# Patient Record
Sex: Female | Born: 1940
Health system: Southern US, Community
[De-identification: ages and names within clinical notes are randomized; demographics above are authoritative.]

## PROBLEM LIST (undated history)

## (undated) DIAGNOSIS — C44310 Basal cell carcinoma of skin of unspecified parts of face: Secondary | ICD-10-CM

## (undated) DIAGNOSIS — Z87898 Personal history of other specified conditions: Secondary | ICD-10-CM

## (undated) DIAGNOSIS — T7840XA Allergy, unspecified, initial encounter: Secondary | ICD-10-CM

## (undated) DIAGNOSIS — I2510 Atherosclerotic heart disease of native coronary artery without angina pectoris: Secondary | ICD-10-CM

## (undated) DIAGNOSIS — E871 Hypo-osmolality and hyponatremia: Secondary | ICD-10-CM

## (undated) DIAGNOSIS — J189 Pneumonia, unspecified organism: Secondary | ICD-10-CM

## (undated) DIAGNOSIS — R4181 Age-related cognitive decline: Secondary | ICD-10-CM

## (undated) DIAGNOSIS — K5730 Diverticulosis of large intestine without perforation or abscess without bleeding: Secondary | ICD-10-CM

## (undated) DIAGNOSIS — I10 Essential (primary) hypertension: Secondary | ICD-10-CM

## (undated) DIAGNOSIS — R911 Solitary pulmonary nodule: Secondary | ICD-10-CM

## (undated) DIAGNOSIS — C4370 Malignant melanoma of unspecified lower limb, including hip: Secondary | ICD-10-CM

## (undated) DIAGNOSIS — J439 Emphysema, unspecified: Secondary | ICD-10-CM

## (undated) DIAGNOSIS — R5382 Chronic fatigue, unspecified: Secondary | ICD-10-CM

## (undated) DIAGNOSIS — I739 Peripheral vascular disease, unspecified: Secondary | ICD-10-CM

## (undated) DIAGNOSIS — E785 Hyperlipidemia, unspecified: Secondary | ICD-10-CM

## (undated) HISTORY — DX: Essential (primary) hypertension: I10

## (undated) HISTORY — DX: Basal cell carcinoma of skin of unspecified parts of face: C44.310

## (undated) HISTORY — DX: Peripheral vascular disease, unspecified: I73.9

## (undated) HISTORY — DX: Age-related cognitive decline: R41.81

## (undated) HISTORY — DX: Hyperlipidemia, unspecified: E78.5

## (undated) HISTORY — DX: Allergy, unspecified, initial encounter: T78.40XA

## (undated) HISTORY — DX: Diverticulosis of large intestine without perforation or abscess without bleeding: K57.30

## (undated) HISTORY — DX: Chronic fatigue, unspecified: R53.82

## (undated) HISTORY — PX: BASAL CELL CARCINOMA EXCISION: SHX1214

## (undated) HISTORY — PX: MELANOMA EXCISION: SHX5266

## (undated) HISTORY — DX: Malignant melanoma of unspecified lower limb, including hip: C43.70

## (undated) HISTORY — DX: Solitary pulmonary nodule: R91.1

## (undated) HISTORY — DX: Personal history of other specified conditions: Z87.898

## (undated) HISTORY — DX: Emphysema, unspecified: J43.9

## (undated) HISTORY — DX: Atherosclerotic heart disease of native coronary artery without angina pectoris: I25.10

## (undated) HISTORY — DX: Hypo-osmolality and hyponatremia: E87.1

## (undated) HISTORY — PX: APPENDECTOMY: SHX54

---

## 2016-02-15 DIAGNOSIS — R69 Illness, unspecified: Secondary | ICD-10-CM | POA: Diagnosis not present

## 2016-03-07 DIAGNOSIS — B078 Other viral warts: Secondary | ICD-10-CM | POA: Diagnosis not present

## 2016-03-07 DIAGNOSIS — Z08 Encounter for follow-up examination after completed treatment for malignant neoplasm: Secondary | ICD-10-CM | POA: Diagnosis not present

## 2016-03-07 DIAGNOSIS — L570 Actinic keratosis: Secondary | ICD-10-CM | POA: Diagnosis not present

## 2016-03-07 DIAGNOSIS — Z1283 Encounter for screening for malignant neoplasm of skin: Secondary | ICD-10-CM | POA: Diagnosis not present

## 2016-03-07 DIAGNOSIS — X32XXXD Exposure to sunlight, subsequent encounter: Secondary | ICD-10-CM | POA: Diagnosis not present

## 2016-03-07 DIAGNOSIS — Z85820 Personal history of malignant melanoma of skin: Secondary | ICD-10-CM | POA: Diagnosis not present

## 2016-03-14 DIAGNOSIS — J069 Acute upper respiratory infection, unspecified: Secondary | ICD-10-CM | POA: Diagnosis not present

## 2016-03-15 DIAGNOSIS — J069 Acute upper respiratory infection, unspecified: Secondary | ICD-10-CM | POA: Diagnosis not present

## 2016-03-30 DIAGNOSIS — J209 Acute bronchitis, unspecified: Secondary | ICD-10-CM | POA: Diagnosis not present

## 2016-04-30 DIAGNOSIS — L570 Actinic keratosis: Secondary | ICD-10-CM | POA: Diagnosis not present

## 2016-04-30 DIAGNOSIS — X32XXXD Exposure to sunlight, subsequent encounter: Secondary | ICD-10-CM | POA: Diagnosis not present

## 2016-04-30 DIAGNOSIS — L820 Inflamed seborrheic keratosis: Secondary | ICD-10-CM | POA: Diagnosis not present

## 2016-05-01 DIAGNOSIS — R69 Illness, unspecified: Secondary | ICD-10-CM | POA: Diagnosis not present

## 2016-05-02 DIAGNOSIS — Z6821 Body mass index (BMI) 21.0-21.9, adult: Secondary | ICD-10-CM | POA: Diagnosis not present

## 2016-05-02 DIAGNOSIS — Z87891 Personal history of nicotine dependence: Secondary | ICD-10-CM | POA: Diagnosis not present

## 2016-05-02 DIAGNOSIS — Z0000 Encounter for general adult medical examination without abnormal findings: Secondary | ICD-10-CM | POA: Diagnosis not present

## 2016-05-05 DIAGNOSIS — H524 Presbyopia: Secondary | ICD-10-CM | POA: Diagnosis not present

## 2016-05-28 DIAGNOSIS — Z0100 Encounter for examination of eyes and vision without abnormal findings: Secondary | ICD-10-CM | POA: Diagnosis not present

## 2016-06-13 DIAGNOSIS — L820 Inflamed seborrheic keratosis: Secondary | ICD-10-CM | POA: Diagnosis not present

## 2016-06-13 DIAGNOSIS — L570 Actinic keratosis: Secondary | ICD-10-CM | POA: Diagnosis not present

## 2016-06-13 DIAGNOSIS — C4372 Malignant melanoma of left lower limb, including hip: Secondary | ICD-10-CM | POA: Diagnosis not present

## 2016-06-13 DIAGNOSIS — X32XXXD Exposure to sunlight, subsequent encounter: Secondary | ICD-10-CM | POA: Diagnosis not present

## 2016-06-25 DIAGNOSIS — C4372 Malignant melanoma of left lower limb, including hip: Secondary | ICD-10-CM | POA: Diagnosis not present

## 2016-07-16 DIAGNOSIS — X32XXXD Exposure to sunlight, subsequent encounter: Secondary | ICD-10-CM | POA: Diagnosis not present

## 2016-07-16 DIAGNOSIS — L570 Actinic keratosis: Secondary | ICD-10-CM | POA: Diagnosis not present

## 2016-08-01 DIAGNOSIS — H61002 Unspecified perichondritis of left external ear: Secondary | ICD-10-CM | POA: Diagnosis not present

## 2016-09-26 DIAGNOSIS — L814 Other melanin hyperpigmentation: Secondary | ICD-10-CM | POA: Diagnosis not present

## 2016-09-26 DIAGNOSIS — Z1283 Encounter for screening for malignant neoplasm of skin: Secondary | ICD-10-CM | POA: Diagnosis not present

## 2016-09-26 DIAGNOSIS — Z85820 Personal history of malignant melanoma of skin: Secondary | ICD-10-CM | POA: Diagnosis not present

## 2016-09-26 DIAGNOSIS — D225 Melanocytic nevi of trunk: Secondary | ICD-10-CM | POA: Diagnosis not present

## 2016-09-26 DIAGNOSIS — D485 Neoplasm of uncertain behavior of skin: Secondary | ICD-10-CM | POA: Diagnosis not present

## 2016-09-26 DIAGNOSIS — L821 Other seborrheic keratosis: Secondary | ICD-10-CM | POA: Diagnosis not present

## 2016-09-26 DIAGNOSIS — L818 Other specified disorders of pigmentation: Secondary | ICD-10-CM | POA: Diagnosis not present

## 2016-09-26 DIAGNOSIS — Z08 Encounter for follow-up examination after completed treatment for malignant neoplasm: Secondary | ICD-10-CM | POA: Diagnosis not present

## 2016-11-24 DIAGNOSIS — R69 Illness, unspecified: Secondary | ICD-10-CM | POA: Diagnosis not present

## 2017-01-14 DIAGNOSIS — L820 Inflamed seborrheic keratosis: Secondary | ICD-10-CM | POA: Diagnosis not present

## 2017-01-14 DIAGNOSIS — L821 Other seborrheic keratosis: Secondary | ICD-10-CM | POA: Diagnosis not present

## 2017-01-14 DIAGNOSIS — Z08 Encounter for follow-up examination after completed treatment for malignant neoplasm: Secondary | ICD-10-CM | POA: Diagnosis not present

## 2017-01-14 DIAGNOSIS — Z1283 Encounter for screening for malignant neoplasm of skin: Secondary | ICD-10-CM | POA: Diagnosis not present

## 2017-01-14 DIAGNOSIS — Z85820 Personal history of malignant melanoma of skin: Secondary | ICD-10-CM | POA: Diagnosis not present

## 2017-03-20 DIAGNOSIS — R194 Change in bowel habit: Secondary | ICD-10-CM | POA: Diagnosis not present

## 2017-03-29 DIAGNOSIS — R194 Change in bowel habit: Secondary | ICD-10-CM | POA: Diagnosis not present

## 2017-06-02 DIAGNOSIS — Z0100 Encounter for examination of eyes and vision without abnormal findings: Secondary | ICD-10-CM | POA: Diagnosis not present

## 2017-06-02 DIAGNOSIS — H524 Presbyopia: Secondary | ICD-10-CM | POA: Diagnosis not present

## 2017-06-03 DIAGNOSIS — X32XXXD Exposure to sunlight, subsequent encounter: Secondary | ICD-10-CM | POA: Diagnosis not present

## 2017-06-03 DIAGNOSIS — L820 Inflamed seborrheic keratosis: Secondary | ICD-10-CM | POA: Diagnosis not present

## 2017-06-03 DIAGNOSIS — L570 Actinic keratosis: Secondary | ICD-10-CM | POA: Diagnosis not present

## 2017-06-03 DIAGNOSIS — Z08 Encounter for follow-up examination after completed treatment for malignant neoplasm: Secondary | ICD-10-CM | POA: Diagnosis not present

## 2017-06-03 DIAGNOSIS — Z85820 Personal history of malignant melanoma of skin: Secondary | ICD-10-CM | POA: Diagnosis not present

## 2017-06-03 DIAGNOSIS — Z1283 Encounter for screening for malignant neoplasm of skin: Secondary | ICD-10-CM | POA: Diagnosis not present

## 2017-07-17 DIAGNOSIS — L820 Inflamed seborrheic keratosis: Secondary | ICD-10-CM | POA: Diagnosis not present

## 2017-07-17 DIAGNOSIS — Z85820 Personal history of malignant melanoma of skin: Secondary | ICD-10-CM | POA: Diagnosis not present

## 2017-07-17 DIAGNOSIS — Z08 Encounter for follow-up examination after completed treatment for malignant neoplasm: Secondary | ICD-10-CM | POA: Diagnosis not present

## 2017-07-17 DIAGNOSIS — L03115 Cellulitis of right lower limb: Secondary | ICD-10-CM | POA: Diagnosis not present

## 2017-07-31 DIAGNOSIS — X32XXXD Exposure to sunlight, subsequent encounter: Secondary | ICD-10-CM | POA: Diagnosis not present

## 2017-07-31 DIAGNOSIS — L820 Inflamed seborrheic keratosis: Secondary | ICD-10-CM | POA: Diagnosis not present

## 2017-07-31 DIAGNOSIS — L570 Actinic keratosis: Secondary | ICD-10-CM | POA: Diagnosis not present

## 2017-08-21 DIAGNOSIS — Z85820 Personal history of malignant melanoma of skin: Secondary | ICD-10-CM | POA: Diagnosis not present

## 2017-08-21 DIAGNOSIS — B078 Other viral warts: Secondary | ICD-10-CM | POA: Diagnosis not present

## 2017-08-21 DIAGNOSIS — Z08 Encounter for follow-up examination after completed treatment for malignant neoplasm: Secondary | ICD-10-CM | POA: Diagnosis not present

## 2017-08-21 DIAGNOSIS — L820 Inflamed seborrheic keratosis: Secondary | ICD-10-CM | POA: Diagnosis not present

## 2017-10-21 DIAGNOSIS — Z1283 Encounter for screening for malignant neoplasm of skin: Secondary | ICD-10-CM | POA: Diagnosis not present

## 2017-10-21 DIAGNOSIS — B078 Other viral warts: Secondary | ICD-10-CM | POA: Diagnosis not present

## 2017-10-21 DIAGNOSIS — Z08 Encounter for follow-up examination after completed treatment for malignant neoplasm: Secondary | ICD-10-CM | POA: Diagnosis not present

## 2017-10-21 DIAGNOSIS — Z85820 Personal history of malignant melanoma of skin: Secondary | ICD-10-CM | POA: Diagnosis not present

## 2017-10-21 DIAGNOSIS — L820 Inflamed seborrheic keratosis: Secondary | ICD-10-CM | POA: Diagnosis not present

## 2017-12-05 DIAGNOSIS — R69 Illness, unspecified: Secondary | ICD-10-CM | POA: Diagnosis not present

## 2017-12-30 DIAGNOSIS — Z08 Encounter for follow-up examination after completed treatment for malignant neoplasm: Secondary | ICD-10-CM | POA: Diagnosis not present

## 2017-12-30 DIAGNOSIS — Z85820 Personal history of malignant melanoma of skin: Secondary | ICD-10-CM | POA: Diagnosis not present

## 2017-12-30 DIAGNOSIS — X32XXXD Exposure to sunlight, subsequent encounter: Secondary | ICD-10-CM | POA: Diagnosis not present

## 2017-12-30 DIAGNOSIS — B078 Other viral warts: Secondary | ICD-10-CM | POA: Diagnosis not present

## 2017-12-30 DIAGNOSIS — Z1283 Encounter for screening for malignant neoplasm of skin: Secondary | ICD-10-CM | POA: Diagnosis not present

## 2017-12-30 DIAGNOSIS — L570 Actinic keratosis: Secondary | ICD-10-CM | POA: Diagnosis not present

## 2017-12-30 DIAGNOSIS — L820 Inflamed seborrheic keratosis: Secondary | ICD-10-CM | POA: Diagnosis not present

## 2018-01-01 DIAGNOSIS — Z1331 Encounter for screening for depression: Secondary | ICD-10-CM | POA: Diagnosis not present

## 2018-01-01 DIAGNOSIS — Z136 Encounter for screening for cardiovascular disorders: Secondary | ICD-10-CM | POA: Diagnosis not present

## 2018-01-01 DIAGNOSIS — R5383 Other fatigue: Secondary | ICD-10-CM | POA: Diagnosis not present

## 2018-01-01 DIAGNOSIS — Z1159 Encounter for screening for other viral diseases: Secondary | ICD-10-CM | POA: Diagnosis not present

## 2018-01-01 DIAGNOSIS — Z9181 History of falling: Secondary | ICD-10-CM | POA: Diagnosis not present

## 2018-01-01 DIAGNOSIS — Z124 Encounter for screening for malignant neoplasm of cervix: Secondary | ICD-10-CM | POA: Diagnosis not present

## 2018-01-01 DIAGNOSIS — Z1339 Encounter for screening examination for other mental health and behavioral disorders: Secondary | ICD-10-CM | POA: Diagnosis not present

## 2018-01-10 DIAGNOSIS — Z1231 Encounter for screening mammogram for malignant neoplasm of breast: Secondary | ICD-10-CM | POA: Diagnosis not present

## 2018-01-10 DIAGNOSIS — Z1239 Encounter for other screening for malignant neoplasm of breast: Secondary | ICD-10-CM | POA: Diagnosis not present

## 2018-02-05 DIAGNOSIS — L820 Inflamed seborrheic keratosis: Secondary | ICD-10-CM | POA: Diagnosis not present

## 2018-02-05 DIAGNOSIS — C44722 Squamous cell carcinoma of skin of right lower limb, including hip: Secondary | ICD-10-CM | POA: Diagnosis not present

## 2018-02-06 DIAGNOSIS — Z6821 Body mass index (BMI) 21.0-21.9, adult: Secondary | ICD-10-CM | POA: Diagnosis not present

## 2018-02-06 DIAGNOSIS — J9801 Acute bronchospasm: Secondary | ICD-10-CM | POA: Diagnosis not present

## 2018-02-06 DIAGNOSIS — N952 Postmenopausal atrophic vaginitis: Secondary | ICD-10-CM | POA: Diagnosis not present

## 2018-02-28 DIAGNOSIS — J069 Acute upper respiratory infection, unspecified: Secondary | ICD-10-CM | POA: Diagnosis not present

## 2018-03-03 DIAGNOSIS — J209 Acute bronchitis, unspecified: Secondary | ICD-10-CM | POA: Diagnosis not present

## 2018-03-03 DIAGNOSIS — J019 Acute sinusitis, unspecified: Secondary | ICD-10-CM | POA: Diagnosis not present

## 2018-03-09 DIAGNOSIS — R0982 Postnasal drip: Secondary | ICD-10-CM | POA: Diagnosis not present

## 2018-03-09 DIAGNOSIS — J209 Acute bronchitis, unspecified: Secondary | ICD-10-CM | POA: Diagnosis not present

## 2018-03-19 DIAGNOSIS — L821 Other seborrheic keratosis: Secondary | ICD-10-CM | POA: Diagnosis not present

## 2018-03-19 DIAGNOSIS — C44529 Squamous cell carcinoma of skin of other part of trunk: Secondary | ICD-10-CM | POA: Diagnosis not present

## 2018-03-19 DIAGNOSIS — D225 Melanocytic nevi of trunk: Secondary | ICD-10-CM | POA: Diagnosis not present

## 2018-03-19 DIAGNOSIS — D0362 Melanoma in situ of left upper limb, including shoulder: Secondary | ICD-10-CM | POA: Diagnosis not present

## 2018-03-19 DIAGNOSIS — L820 Inflamed seborrheic keratosis: Secondary | ICD-10-CM | POA: Diagnosis not present

## 2018-03-19 DIAGNOSIS — Z85820 Personal history of malignant melanoma of skin: Secondary | ICD-10-CM | POA: Diagnosis not present

## 2018-03-19 DIAGNOSIS — Z08 Encounter for follow-up examination after completed treatment for malignant neoplasm: Secondary | ICD-10-CM | POA: Diagnosis not present

## 2018-03-31 DIAGNOSIS — D485 Neoplasm of uncertain behavior of skin: Secondary | ICD-10-CM | POA: Diagnosis not present

## 2018-03-31 DIAGNOSIS — D0362 Melanoma in situ of left upper limb, including shoulder: Secondary | ICD-10-CM | POA: Diagnosis not present

## 2018-04-01 DIAGNOSIS — R0789 Other chest pain: Secondary | ICD-10-CM | POA: Diagnosis not present

## 2018-04-01 DIAGNOSIS — Z6821 Body mass index (BMI) 21.0-21.9, adult: Secondary | ICD-10-CM | POA: Diagnosis not present

## 2018-04-01 DIAGNOSIS — R252 Cramp and spasm: Secondary | ICD-10-CM | POA: Diagnosis not present

## 2018-04-30 DIAGNOSIS — Z08 Encounter for follow-up examination after completed treatment for malignant neoplasm: Secondary | ICD-10-CM | POA: Diagnosis not present

## 2018-04-30 DIAGNOSIS — L570 Actinic keratosis: Secondary | ICD-10-CM | POA: Diagnosis not present

## 2018-04-30 DIAGNOSIS — Z1283 Encounter for screening for malignant neoplasm of skin: Secondary | ICD-10-CM | POA: Diagnosis not present

## 2018-04-30 DIAGNOSIS — X32XXXD Exposure to sunlight, subsequent encounter: Secondary | ICD-10-CM | POA: Diagnosis not present

## 2018-04-30 DIAGNOSIS — Z85820 Personal history of malignant melanoma of skin: Secondary | ICD-10-CM | POA: Diagnosis not present

## 2018-04-30 DIAGNOSIS — L821 Other seborrheic keratosis: Secondary | ICD-10-CM | POA: Diagnosis not present

## 2018-04-30 DIAGNOSIS — L820 Inflamed seborrheic keratosis: Secondary | ICD-10-CM | POA: Diagnosis not present

## 2018-05-01 DIAGNOSIS — Z6821 Body mass index (BMI) 21.0-21.9, adult: Secondary | ICD-10-CM | POA: Diagnosis not present

## 2018-05-01 DIAGNOSIS — R0789 Other chest pain: Secondary | ICD-10-CM | POA: Diagnosis not present

## 2018-05-01 DIAGNOSIS — R252 Cramp and spasm: Secondary | ICD-10-CM | POA: Diagnosis not present

## 2018-05-08 DIAGNOSIS — W109XXA Fall (on) (from) unspecified stairs and steps, initial encounter: Secondary | ICD-10-CM | POA: Diagnosis not present

## 2018-05-08 DIAGNOSIS — Z87891 Personal history of nicotine dependence: Secondary | ICD-10-CM | POA: Diagnosis not present

## 2018-05-08 DIAGNOSIS — S0990XA Unspecified injury of head, initial encounter: Secondary | ICD-10-CM | POA: Diagnosis not present

## 2018-05-08 DIAGNOSIS — S0003XA Contusion of scalp, initial encounter: Secondary | ICD-10-CM | POA: Diagnosis not present

## 2018-05-08 DIAGNOSIS — S59911A Unspecified injury of right forearm, initial encounter: Secondary | ICD-10-CM | POA: Diagnosis not present

## 2018-05-08 DIAGNOSIS — S299XXA Unspecified injury of thorax, initial encounter: Secondary | ICD-10-CM | POA: Diagnosis not present

## 2018-05-08 DIAGNOSIS — Z23 Encounter for immunization: Secondary | ICD-10-CM | POA: Diagnosis not present

## 2018-05-08 DIAGNOSIS — M79631 Pain in right forearm: Secondary | ICD-10-CM | POA: Diagnosis not present

## 2018-05-08 DIAGNOSIS — S79911A Unspecified injury of right hip, initial encounter: Secondary | ICD-10-CM | POA: Diagnosis not present

## 2018-05-08 DIAGNOSIS — S0083XA Contusion of other part of head, initial encounter: Secondary | ICD-10-CM | POA: Diagnosis not present

## 2018-05-08 DIAGNOSIS — M25551 Pain in right hip: Secondary | ICD-10-CM | POA: Diagnosis not present

## 2018-05-08 DIAGNOSIS — S199XXA Unspecified injury of neck, initial encounter: Secondary | ICD-10-CM | POA: Diagnosis not present

## 2018-05-12 DIAGNOSIS — S81801A Unspecified open wound, right lower leg, initial encounter: Secondary | ICD-10-CM | POA: Diagnosis not present

## 2018-05-12 DIAGNOSIS — S51801A Unspecified open wound of right forearm, initial encounter: Secondary | ICD-10-CM | POA: Diagnosis not present

## 2018-05-12 DIAGNOSIS — S1093XA Contusion of unspecified part of neck, initial encounter: Secondary | ICD-10-CM | POA: Diagnosis not present

## 2018-05-12 DIAGNOSIS — Z9181 History of falling: Secondary | ICD-10-CM | POA: Diagnosis not present

## 2018-05-14 DIAGNOSIS — S060X9A Concussion with loss of consciousness of unspecified duration, initial encounter: Secondary | ICD-10-CM | POA: Diagnosis not present

## 2018-06-12 DIAGNOSIS — H538 Other visual disturbances: Secondary | ICD-10-CM | POA: Diagnosis not present

## 2018-06-12 DIAGNOSIS — F17200 Nicotine dependence, unspecified, uncomplicated: Secondary | ICD-10-CM | POA: Diagnosis not present

## 2018-06-12 DIAGNOSIS — R69 Illness, unspecified: Secondary | ICD-10-CM | POA: Diagnosis not present

## 2018-06-12 DIAGNOSIS — W11XXXA Fall on and from ladder, initial encounter: Secondary | ICD-10-CM | POA: Diagnosis not present

## 2018-06-12 DIAGNOSIS — I081 Rheumatic disorders of both mitral and tricuspid valves: Secondary | ICD-10-CM | POA: Diagnosis not present

## 2018-06-12 DIAGNOSIS — E871 Hypo-osmolality and hyponatremia: Secondary | ICD-10-CM | POA: Diagnosis not present

## 2018-06-12 DIAGNOSIS — G459 Transient cerebral ischemic attack, unspecified: Secondary | ICD-10-CM | POA: Diagnosis not present

## 2018-06-12 DIAGNOSIS — E870 Hyperosmolality and hypernatremia: Secondary | ICD-10-CM | POA: Diagnosis not present

## 2018-06-12 DIAGNOSIS — I10 Essential (primary) hypertension: Secondary | ICD-10-CM | POA: Diagnosis not present

## 2018-06-12 DIAGNOSIS — I6529 Occlusion and stenosis of unspecified carotid artery: Secondary | ICD-10-CM | POA: Diagnosis not present

## 2018-06-12 DIAGNOSIS — R42 Dizziness and giddiness: Secondary | ICD-10-CM | POA: Diagnosis not present

## 2018-06-12 DIAGNOSIS — S0003XA Contusion of scalp, initial encounter: Secondary | ICD-10-CM | POA: Diagnosis not present

## 2018-06-12 DIAGNOSIS — S0990XA Unspecified injury of head, initial encounter: Secondary | ICD-10-CM | POA: Diagnosis not present

## 2018-06-12 DIAGNOSIS — R918 Other nonspecific abnormal finding of lung field: Secondary | ICD-10-CM | POA: Diagnosis not present

## 2018-06-12 DIAGNOSIS — I160 Hypertensive urgency: Secondary | ICD-10-CM | POA: Diagnosis not present

## 2018-06-12 DIAGNOSIS — Z9049 Acquired absence of other specified parts of digestive tract: Secondary | ICD-10-CM | POA: Diagnosis not present

## 2018-06-13 DIAGNOSIS — I160 Hypertensive urgency: Secondary | ICD-10-CM | POA: Diagnosis not present

## 2018-06-13 DIAGNOSIS — I6389 Other cerebral infarction: Secondary | ICD-10-CM | POA: Diagnosis not present

## 2018-06-13 DIAGNOSIS — N871 Moderate cervical dysplasia: Secondary | ICD-10-CM | POA: Diagnosis not present

## 2018-06-13 DIAGNOSIS — I6789 Other cerebrovascular disease: Secondary | ICD-10-CM | POA: Diagnosis not present

## 2018-06-13 DIAGNOSIS — H538 Other visual disturbances: Secondary | ICD-10-CM | POA: Diagnosis not present

## 2018-06-13 DIAGNOSIS — R42 Dizziness and giddiness: Secondary | ICD-10-CM | POA: Diagnosis not present

## 2018-06-13 DIAGNOSIS — G459 Transient cerebral ischemic attack, unspecified: Secondary | ICD-10-CM | POA: Diagnosis not present

## 2018-06-14 DIAGNOSIS — I160 Hypertensive urgency: Secondary | ICD-10-CM | POA: Diagnosis not present

## 2018-06-14 DIAGNOSIS — G459 Transient cerebral ischemic attack, unspecified: Secondary | ICD-10-CM | POA: Diagnosis not present

## 2018-07-08 DIAGNOSIS — Z6821 Body mass index (BMI) 21.0-21.9, adult: Secondary | ICD-10-CM | POA: Diagnosis not present

## 2018-07-08 DIAGNOSIS — J9801 Acute bronchospasm: Secondary | ICD-10-CM | POA: Diagnosis not present

## 2018-07-08 DIAGNOSIS — N952 Postmenopausal atrophic vaginitis: Secondary | ICD-10-CM | POA: Diagnosis not present

## 2018-07-08 DIAGNOSIS — R42 Dizziness and giddiness: Secondary | ICD-10-CM | POA: Diagnosis not present

## 2018-07-09 DIAGNOSIS — C44622 Squamous cell carcinoma of skin of right upper limb, including shoulder: Secondary | ICD-10-CM | POA: Diagnosis not present

## 2018-07-09 DIAGNOSIS — Z85820 Personal history of malignant melanoma of skin: Secondary | ICD-10-CM | POA: Diagnosis not present

## 2018-07-09 DIAGNOSIS — Z08 Encounter for follow-up examination after completed treatment for malignant neoplasm: Secondary | ICD-10-CM | POA: Diagnosis not present

## 2018-07-09 DIAGNOSIS — L814 Other melanin hyperpigmentation: Secondary | ICD-10-CM | POA: Diagnosis not present

## 2018-08-04 DIAGNOSIS — L820 Inflamed seborrheic keratosis: Secondary | ICD-10-CM | POA: Diagnosis not present

## 2018-08-04 DIAGNOSIS — Z85828 Personal history of other malignant neoplasm of skin: Secondary | ICD-10-CM | POA: Diagnosis not present

## 2018-08-04 DIAGNOSIS — Z08 Encounter for follow-up examination after completed treatment for malignant neoplasm: Secondary | ICD-10-CM | POA: Diagnosis not present

## 2018-08-08 DIAGNOSIS — Z0100 Encounter for examination of eyes and vision without abnormal findings: Secondary | ICD-10-CM | POA: Diagnosis not present

## 2018-10-15 DIAGNOSIS — H35363 Drusen (degenerative) of macula, bilateral: Secondary | ICD-10-CM | POA: Diagnosis not present

## 2018-10-15 DIAGNOSIS — H5212 Myopia, left eye: Secondary | ICD-10-CM | POA: Diagnosis not present

## 2018-10-29 DIAGNOSIS — D225 Melanocytic nevi of trunk: Secondary | ICD-10-CM | POA: Diagnosis not present

## 2018-10-29 DIAGNOSIS — L820 Inflamed seborrheic keratosis: Secondary | ICD-10-CM | POA: Diagnosis not present

## 2018-10-29 DIAGNOSIS — Z85820 Personal history of malignant melanoma of skin: Secondary | ICD-10-CM | POA: Diagnosis not present

## 2018-10-29 DIAGNOSIS — C44529 Squamous cell carcinoma of skin of other part of trunk: Secondary | ICD-10-CM | POA: Diagnosis not present

## 2018-10-29 DIAGNOSIS — L821 Other seborrheic keratosis: Secondary | ICD-10-CM | POA: Diagnosis not present

## 2018-10-29 DIAGNOSIS — C44519 Basal cell carcinoma of skin of other part of trunk: Secondary | ICD-10-CM | POA: Diagnosis not present

## 2018-10-29 DIAGNOSIS — Z08 Encounter for follow-up examination after completed treatment for malignant neoplasm: Secondary | ICD-10-CM | POA: Diagnosis not present

## 2018-11-05 DIAGNOSIS — H25813 Combined forms of age-related cataract, bilateral: Secondary | ICD-10-CM | POA: Diagnosis not present

## 2018-11-05 DIAGNOSIS — H353132 Nonexudative age-related macular degeneration, bilateral, intermediate dry stage: Secondary | ICD-10-CM | POA: Diagnosis not present

## 2018-11-05 DIAGNOSIS — H524 Presbyopia: Secondary | ICD-10-CM | POA: Diagnosis not present

## 2018-11-05 DIAGNOSIS — H5212 Myopia, left eye: Secondary | ICD-10-CM | POA: Diagnosis not present

## 2018-11-05 DIAGNOSIS — H5201 Hypermetropia, right eye: Secondary | ICD-10-CM | POA: Diagnosis not present

## 2018-11-05 DIAGNOSIS — H52223 Regular astigmatism, bilateral: Secondary | ICD-10-CM | POA: Diagnosis not present

## 2018-11-20 DIAGNOSIS — H25813 Combined forms of age-related cataract, bilateral: Secondary | ICD-10-CM | POA: Diagnosis not present

## 2018-12-03 DIAGNOSIS — N390 Urinary tract infection, site not specified: Secondary | ICD-10-CM | POA: Diagnosis not present

## 2018-12-08 DIAGNOSIS — N390 Urinary tract infection, site not specified: Secondary | ICD-10-CM | POA: Diagnosis not present

## 2018-12-10 DIAGNOSIS — H2512 Age-related nuclear cataract, left eye: Secondary | ICD-10-CM | POA: Diagnosis not present

## 2018-12-26 DIAGNOSIS — R69 Illness, unspecified: Secondary | ICD-10-CM | POA: Diagnosis not present

## 2018-12-31 DIAGNOSIS — L820 Inflamed seborrheic keratosis: Secondary | ICD-10-CM | POA: Diagnosis not present

## 2018-12-31 DIAGNOSIS — Z85820 Personal history of malignant melanoma of skin: Secondary | ICD-10-CM | POA: Diagnosis not present

## 2018-12-31 DIAGNOSIS — Z08 Encounter for follow-up examination after completed treatment for malignant neoplasm: Secondary | ICD-10-CM | POA: Diagnosis not present

## 2018-12-31 DIAGNOSIS — L720 Epidermal cyst: Secondary | ICD-10-CM | POA: Diagnosis not present

## 2018-12-31 DIAGNOSIS — H5203 Hypermetropia, bilateral: Secondary | ICD-10-CM | POA: Diagnosis not present

## 2018-12-31 DIAGNOSIS — Z85828 Personal history of other malignant neoplasm of skin: Secondary | ICD-10-CM | POA: Diagnosis not present

## 2018-12-31 DIAGNOSIS — Z1283 Encounter for screening for malignant neoplasm of skin: Secondary | ICD-10-CM | POA: Diagnosis not present

## 2019-02-04 DIAGNOSIS — X32XXXD Exposure to sunlight, subsequent encounter: Secondary | ICD-10-CM | POA: Diagnosis not present

## 2019-02-04 DIAGNOSIS — L720 Epidermal cyst: Secondary | ICD-10-CM | POA: Diagnosis not present

## 2019-02-04 DIAGNOSIS — L570 Actinic keratosis: Secondary | ICD-10-CM | POA: Diagnosis not present

## 2019-02-04 DIAGNOSIS — L820 Inflamed seborrheic keratosis: Secondary | ICD-10-CM | POA: Diagnosis not present

## 2019-03-10 DIAGNOSIS — Z20822 Contact with and (suspected) exposure to covid-19: Secondary | ICD-10-CM | POA: Diagnosis not present

## 2019-03-12 DIAGNOSIS — R030 Elevated blood-pressure reading, without diagnosis of hypertension: Secondary | ICD-10-CM | POA: Diagnosis not present

## 2019-03-12 DIAGNOSIS — Z1152 Encounter for screening for COVID-19: Secondary | ICD-10-CM | POA: Diagnosis not present

## 2019-03-12 DIAGNOSIS — Z7689 Persons encountering health services in other specified circumstances: Secondary | ICD-10-CM | POA: Diagnosis not present

## 2019-03-24 DIAGNOSIS — J209 Acute bronchitis, unspecified: Secondary | ICD-10-CM | POA: Diagnosis not present

## 2019-04-30 DIAGNOSIS — H52223 Regular astigmatism, bilateral: Secondary | ICD-10-CM | POA: Diagnosis not present

## 2019-04-30 DIAGNOSIS — H5201 Hypermetropia, right eye: Secondary | ICD-10-CM | POA: Diagnosis not present

## 2019-04-30 DIAGNOSIS — Z9842 Cataract extraction status, left eye: Secondary | ICD-10-CM | POA: Diagnosis not present

## 2019-04-30 DIAGNOSIS — H35432 Paving stone degeneration of retina, left eye: Secondary | ICD-10-CM | POA: Diagnosis not present

## 2019-04-30 DIAGNOSIS — H2511 Age-related nuclear cataract, right eye: Secondary | ICD-10-CM | POA: Diagnosis not present

## 2019-05-07 DIAGNOSIS — Z9181 History of falling: Secondary | ICD-10-CM | POA: Diagnosis not present

## 2019-05-07 DIAGNOSIS — Z139 Encounter for screening, unspecified: Secondary | ICD-10-CM | POA: Diagnosis not present

## 2019-05-07 DIAGNOSIS — Z1331 Encounter for screening for depression: Secondary | ICD-10-CM | POA: Diagnosis not present

## 2019-05-07 DIAGNOSIS — Z0000 Encounter for general adult medical examination without abnormal findings: Secondary | ICD-10-CM | POA: Diagnosis not present

## 2019-05-20 DIAGNOSIS — H2511 Age-related nuclear cataract, right eye: Secondary | ICD-10-CM | POA: Diagnosis not present

## 2019-07-09 DIAGNOSIS — H353131 Nonexudative age-related macular degeneration, bilateral, early dry stage: Secondary | ICD-10-CM | POA: Diagnosis not present

## 2019-07-15 DIAGNOSIS — H353130 Nonexudative age-related macular degeneration, bilateral, stage unspecified: Secondary | ICD-10-CM | POA: Diagnosis not present

## 2019-07-29 DIAGNOSIS — Z961 Presence of intraocular lens: Secondary | ICD-10-CM | POA: Diagnosis not present

## 2019-07-29 DIAGNOSIS — H353130 Nonexudative age-related macular degeneration, bilateral, stage unspecified: Secondary | ICD-10-CM | POA: Diagnosis not present

## 2019-07-29 DIAGNOSIS — H43813 Vitreous degeneration, bilateral: Secondary | ICD-10-CM | POA: Diagnosis not present

## 2019-08-03 DIAGNOSIS — H26492 Other secondary cataract, left eye: Secondary | ICD-10-CM | POA: Diagnosis not present

## 2019-08-03 DIAGNOSIS — Z961 Presence of intraocular lens: Secondary | ICD-10-CM | POA: Diagnosis not present

## 2019-08-03 DIAGNOSIS — H11422 Conjunctival edema, left eye: Secondary | ICD-10-CM | POA: Diagnosis not present

## 2019-08-03 DIAGNOSIS — H43813 Vitreous degeneration, bilateral: Secondary | ICD-10-CM | POA: Diagnosis not present

## 2019-08-03 DIAGNOSIS — H353130 Nonexudative age-related macular degeneration, bilateral, stage unspecified: Secondary | ICD-10-CM | POA: Diagnosis not present

## 2019-08-19 DIAGNOSIS — Z1283 Encounter for screening for malignant neoplasm of skin: Secondary | ICD-10-CM | POA: Diagnosis not present

## 2019-08-19 DIAGNOSIS — L821 Other seborrheic keratosis: Secondary | ICD-10-CM | POA: Diagnosis not present

## 2019-08-19 DIAGNOSIS — L820 Inflamed seborrheic keratosis: Secondary | ICD-10-CM | POA: Diagnosis not present

## 2019-08-19 DIAGNOSIS — L570 Actinic keratosis: Secondary | ICD-10-CM | POA: Diagnosis not present

## 2019-08-19 DIAGNOSIS — X32XXXD Exposure to sunlight, subsequent encounter: Secondary | ICD-10-CM | POA: Diagnosis not present

## 2019-08-19 DIAGNOSIS — Z85820 Personal history of malignant melanoma of skin: Secondary | ICD-10-CM | POA: Diagnosis not present

## 2019-08-19 DIAGNOSIS — Z08 Encounter for follow-up examination after completed treatment for malignant neoplasm: Secondary | ICD-10-CM | POA: Diagnosis not present

## 2019-09-01 DIAGNOSIS — M7918 Myalgia, other site: Secondary | ICD-10-CM | POA: Diagnosis not present

## 2019-09-21 DIAGNOSIS — M7918 Myalgia, other site: Secondary | ICD-10-CM | POA: Diagnosis not present

## 2019-10-02 DIAGNOSIS — H35363 Drusen (degenerative) of macula, bilateral: Secondary | ICD-10-CM | POA: Diagnosis not present

## 2019-10-02 DIAGNOSIS — Z961 Presence of intraocular lens: Secondary | ICD-10-CM | POA: Diagnosis not present

## 2019-10-02 DIAGNOSIS — H52223 Regular astigmatism, bilateral: Secondary | ICD-10-CM | POA: Diagnosis not present

## 2019-10-02 DIAGNOSIS — H5201 Hypermetropia, right eye: Secondary | ICD-10-CM | POA: Diagnosis not present

## 2019-10-02 DIAGNOSIS — H04123 Dry eye syndrome of bilateral lacrimal glands: Secondary | ICD-10-CM | POA: Diagnosis not present

## 2019-10-02 DIAGNOSIS — H353131 Nonexudative age-related macular degeneration, bilateral, early dry stage: Secondary | ICD-10-CM | POA: Diagnosis not present

## 2019-10-02 DIAGNOSIS — H5340 Unspecified visual field defects: Secondary | ICD-10-CM | POA: Diagnosis not present

## 2019-10-18 DIAGNOSIS — I160 Hypertensive urgency: Secondary | ICD-10-CM | POA: Diagnosis not present

## 2019-10-21 DIAGNOSIS — I10 Essential (primary) hypertension: Secondary | ICD-10-CM | POA: Diagnosis not present

## 2019-10-29 DIAGNOSIS — I10 Essential (primary) hypertension: Secondary | ICD-10-CM | POA: Diagnosis not present

## 2019-11-23 DIAGNOSIS — I10 Essential (primary) hypertension: Secondary | ICD-10-CM | POA: Insufficient documentation

## 2019-11-23 DIAGNOSIS — J302 Other seasonal allergic rhinitis: Secondary | ICD-10-CM | POA: Diagnosis not present

## 2019-12-08 DIAGNOSIS — Z1283 Encounter for screening for malignant neoplasm of skin: Secondary | ICD-10-CM | POA: Diagnosis not present

## 2019-12-08 DIAGNOSIS — Z85820 Personal history of malignant melanoma of skin: Secondary | ICD-10-CM | POA: Diagnosis not present

## 2019-12-08 DIAGNOSIS — L570 Actinic keratosis: Secondary | ICD-10-CM | POA: Diagnosis not present

## 2019-12-08 DIAGNOSIS — Z08 Encounter for follow-up examination after completed treatment for malignant neoplasm: Secondary | ICD-10-CM | POA: Diagnosis not present

## 2019-12-08 DIAGNOSIS — X32XXXD Exposure to sunlight, subsequent encounter: Secondary | ICD-10-CM | POA: Diagnosis not present

## 2019-12-08 DIAGNOSIS — L821 Other seborrheic keratosis: Secondary | ICD-10-CM | POA: Diagnosis not present

## 2019-12-09 DIAGNOSIS — J309 Allergic rhinitis, unspecified: Secondary | ICD-10-CM | POA: Diagnosis not present

## 2019-12-09 DIAGNOSIS — I10 Essential (primary) hypertension: Secondary | ICD-10-CM | POA: Diagnosis not present

## 2019-12-09 DIAGNOSIS — Z6821 Body mass index (BMI) 21.0-21.9, adult: Secondary | ICD-10-CM | POA: Diagnosis not present

## 2019-12-25 DIAGNOSIS — I10 Essential (primary) hypertension: Secondary | ICD-10-CM | POA: Diagnosis not present

## 2019-12-25 DIAGNOSIS — R6889 Other general symptoms and signs: Secondary | ICD-10-CM | POA: Diagnosis not present

## 2019-12-25 DIAGNOSIS — I739 Peripheral vascular disease, unspecified: Secondary | ICD-10-CM | POA: Diagnosis not present

## 2019-12-30 DIAGNOSIS — R059 Cough, unspecified: Secondary | ICD-10-CM | POA: Diagnosis not present

## 2019-12-30 DIAGNOSIS — I10 Essential (primary) hypertension: Secondary | ICD-10-CM | POA: Diagnosis not present

## 2019-12-30 DIAGNOSIS — E871 Hypo-osmolality and hyponatremia: Secondary | ICD-10-CM | POA: Diagnosis not present

## 2020-01-11 DIAGNOSIS — E871 Hypo-osmolality and hyponatremia: Secondary | ICD-10-CM | POA: Diagnosis not present

## 2020-01-14 DIAGNOSIS — I10 Essential (primary) hypertension: Secondary | ICD-10-CM | POA: Diagnosis not present

## 2020-01-14 DIAGNOSIS — N952 Postmenopausal atrophic vaginitis: Secondary | ICD-10-CM | POA: Diagnosis not present

## 2020-01-14 DIAGNOSIS — E871 Hypo-osmolality and hyponatremia: Secondary | ICD-10-CM | POA: Diagnosis not present

## 2020-01-28 DIAGNOSIS — I10 Essential (primary) hypertension: Secondary | ICD-10-CM | POA: Diagnosis not present

## 2020-02-15 DIAGNOSIS — Z961 Presence of intraocular lens: Secondary | ICD-10-CM | POA: Diagnosis not present

## 2020-02-15 DIAGNOSIS — H353131 Nonexudative age-related macular degeneration, bilateral, early dry stage: Secondary | ICD-10-CM | POA: Diagnosis not present

## 2020-02-15 DIAGNOSIS — H04123 Dry eye syndrome of bilateral lacrimal glands: Secondary | ICD-10-CM | POA: Diagnosis not present

## 2020-02-15 DIAGNOSIS — H52223 Regular astigmatism, bilateral: Secondary | ICD-10-CM | POA: Diagnosis not present

## 2020-02-15 DIAGNOSIS — H5201 Hypermetropia, right eye: Secondary | ICD-10-CM | POA: Diagnosis not present

## 2020-02-15 DIAGNOSIS — H5340 Unspecified visual field defects: Secondary | ICD-10-CM | POA: Diagnosis not present

## 2020-02-22 DIAGNOSIS — Z08 Encounter for follow-up examination after completed treatment for malignant neoplasm: Secondary | ICD-10-CM | POA: Diagnosis not present

## 2020-02-22 DIAGNOSIS — D1801 Hemangioma of skin and subcutaneous tissue: Secondary | ICD-10-CM | POA: Diagnosis not present

## 2020-02-22 DIAGNOSIS — Z85820 Personal history of malignant melanoma of skin: Secondary | ICD-10-CM | POA: Diagnosis not present

## 2020-02-22 DIAGNOSIS — C44722 Squamous cell carcinoma of skin of right lower limb, including hip: Secondary | ICD-10-CM | POA: Diagnosis not present

## 2020-02-22 DIAGNOSIS — D225 Melanocytic nevi of trunk: Secondary | ICD-10-CM | POA: Diagnosis not present

## 2020-02-22 DIAGNOSIS — L820 Inflamed seborrheic keratosis: Secondary | ICD-10-CM | POA: Diagnosis not present

## 2020-03-24 DIAGNOSIS — D229 Melanocytic nevi, unspecified: Secondary | ICD-10-CM | POA: Diagnosis not present

## 2020-03-24 DIAGNOSIS — I10 Essential (primary) hypertension: Secondary | ICD-10-CM | POA: Diagnosis not present

## 2020-03-24 DIAGNOSIS — N952 Postmenopausal atrophic vaginitis: Secondary | ICD-10-CM | POA: Diagnosis not present

## 2020-03-30 DIAGNOSIS — C44329 Squamous cell carcinoma of skin of other parts of face: Secondary | ICD-10-CM | POA: Diagnosis not present

## 2020-03-30 DIAGNOSIS — Z85828 Personal history of other malignant neoplasm of skin: Secondary | ICD-10-CM | POA: Diagnosis not present

## 2020-03-30 DIAGNOSIS — Z08 Encounter for follow-up examination after completed treatment for malignant neoplasm: Secondary | ICD-10-CM | POA: Diagnosis not present

## 2020-04-11 DIAGNOSIS — R531 Weakness: Secondary | ICD-10-CM | POA: Diagnosis not present

## 2020-04-11 DIAGNOSIS — R5382 Chronic fatigue, unspecified: Secondary | ICD-10-CM | POA: Diagnosis not present

## 2020-04-11 DIAGNOSIS — I10 Essential (primary) hypertension: Secondary | ICD-10-CM | POA: Diagnosis not present

## 2020-04-12 DIAGNOSIS — I10 Essential (primary) hypertension: Secondary | ICD-10-CM | POA: Diagnosis not present

## 2020-04-20 DIAGNOSIS — E871 Hypo-osmolality and hyponatremia: Secondary | ICD-10-CM | POA: Diagnosis not present

## 2020-04-20 DIAGNOSIS — R000 Tachycardia, unspecified: Secondary | ICD-10-CM | POA: Diagnosis not present

## 2020-04-20 DIAGNOSIS — R079 Chest pain, unspecified: Secondary | ICD-10-CM | POA: Diagnosis not present

## 2020-04-20 DIAGNOSIS — Z87891 Personal history of nicotine dependence: Secondary | ICD-10-CM | POA: Diagnosis not present

## 2020-04-20 DIAGNOSIS — R69 Illness, unspecified: Secondary | ICD-10-CM | POA: Diagnosis not present

## 2020-04-20 DIAGNOSIS — I10 Essential (primary) hypertension: Secondary | ICD-10-CM | POA: Diagnosis not present

## 2020-04-21 DIAGNOSIS — E871 Hypo-osmolality and hyponatremia: Secondary | ICD-10-CM | POA: Diagnosis not present

## 2020-04-21 DIAGNOSIS — R079 Chest pain, unspecified: Secondary | ICD-10-CM | POA: Diagnosis not present

## 2020-04-29 DIAGNOSIS — E871 Hypo-osmolality and hyponatremia: Secondary | ICD-10-CM | POA: Diagnosis not present

## 2020-04-29 DIAGNOSIS — E878 Other disorders of electrolyte and fluid balance, not elsewhere classified: Secondary | ICD-10-CM | POA: Diagnosis not present

## 2020-05-09 DIAGNOSIS — Z1331 Encounter for screening for depression: Secondary | ICD-10-CM | POA: Diagnosis not present

## 2020-05-09 DIAGNOSIS — I10 Essential (primary) hypertension: Secondary | ICD-10-CM | POA: Diagnosis not present

## 2020-05-09 DIAGNOSIS — Z139 Encounter for screening, unspecified: Secondary | ICD-10-CM | POA: Diagnosis not present

## 2020-05-09 DIAGNOSIS — R69 Illness, unspecified: Secondary | ICD-10-CM | POA: Diagnosis not present

## 2020-05-09 DIAGNOSIS — Z9181 History of falling: Secondary | ICD-10-CM | POA: Diagnosis not present

## 2020-05-09 DIAGNOSIS — Z0000 Encounter for general adult medical examination without abnormal findings: Secondary | ICD-10-CM | POA: Diagnosis not present

## 2020-06-13 DIAGNOSIS — R079 Chest pain, unspecified: Secondary | ICD-10-CM | POA: Diagnosis not present

## 2020-06-13 DIAGNOSIS — R69 Illness, unspecified: Secondary | ICD-10-CM | POA: Diagnosis not present

## 2020-06-13 DIAGNOSIS — I10 Essential (primary) hypertension: Secondary | ICD-10-CM | POA: Diagnosis not present

## 2020-06-13 DIAGNOSIS — E871 Hypo-osmolality and hyponatremia: Secondary | ICD-10-CM | POA: Diagnosis not present

## 2020-06-30 DIAGNOSIS — I361 Nonrheumatic tricuspid (valve) insufficiency: Secondary | ICD-10-CM | POA: Diagnosis not present

## 2020-06-30 DIAGNOSIS — I340 Nonrheumatic mitral (valve) insufficiency: Secondary | ICD-10-CM | POA: Diagnosis not present

## 2020-07-05 DIAGNOSIS — I10 Essential (primary) hypertension: Secondary | ICD-10-CM | POA: Diagnosis not present

## 2020-07-05 DIAGNOSIS — R69 Illness, unspecified: Secondary | ICD-10-CM | POA: Diagnosis not present

## 2020-07-05 DIAGNOSIS — I739 Peripheral vascular disease, unspecified: Secondary | ICD-10-CM | POA: Diagnosis not present

## 2020-07-11 DIAGNOSIS — Z122 Encounter for screening for malignant neoplasm of respiratory organs: Secondary | ICD-10-CM | POA: Diagnosis not present

## 2020-07-11 DIAGNOSIS — R69 Illness, unspecified: Secondary | ICD-10-CM | POA: Diagnosis not present

## 2020-07-11 DIAGNOSIS — Z87891 Personal history of nicotine dependence: Secondary | ICD-10-CM | POA: Diagnosis not present

## 2020-07-11 DIAGNOSIS — R918 Other nonspecific abnormal finding of lung field: Secondary | ICD-10-CM | POA: Diagnosis not present

## 2020-07-26 DIAGNOSIS — I739 Peripheral vascular disease, unspecified: Secondary | ICD-10-CM | POA: Diagnosis not present

## 2020-07-26 DIAGNOSIS — Z87891 Personal history of nicotine dependence: Secondary | ICD-10-CM | POA: Diagnosis not present

## 2020-07-26 DIAGNOSIS — I10 Essential (primary) hypertension: Secondary | ICD-10-CM | POA: Diagnosis not present

## 2020-08-09 DIAGNOSIS — I739 Peripheral vascular disease, unspecified: Secondary | ICD-10-CM | POA: Diagnosis not present

## 2020-08-09 DIAGNOSIS — E785 Hyperlipidemia, unspecified: Secondary | ICD-10-CM | POA: Diagnosis not present

## 2020-08-09 DIAGNOSIS — E871 Hypo-osmolality and hyponatremia: Secondary | ICD-10-CM | POA: Diagnosis not present

## 2020-08-09 DIAGNOSIS — J439 Emphysema, unspecified: Secondary | ICD-10-CM | POA: Diagnosis not present

## 2020-08-09 DIAGNOSIS — I10 Essential (primary) hypertension: Secondary | ICD-10-CM | POA: Diagnosis not present

## 2020-08-09 DIAGNOSIS — R69 Illness, unspecified: Secondary | ICD-10-CM | POA: Diagnosis not present

## 2020-08-09 DIAGNOSIS — I2510 Atherosclerotic heart disease of native coronary artery without angina pectoris: Secondary | ICD-10-CM | POA: Diagnosis not present

## 2020-08-09 DIAGNOSIS — Z79899 Other long term (current) drug therapy: Secondary | ICD-10-CM | POA: Diagnosis not present

## 2020-08-09 DIAGNOSIS — R4181 Age-related cognitive decline: Secondary | ICD-10-CM | POA: Diagnosis not present

## 2020-08-23 DIAGNOSIS — R69 Illness, unspecified: Secondary | ICD-10-CM | POA: Diagnosis not present

## 2020-08-23 DIAGNOSIS — M79604 Pain in right leg: Secondary | ICD-10-CM | POA: Diagnosis not present

## 2020-08-23 DIAGNOSIS — I10 Essential (primary) hypertension: Secondary | ICD-10-CM | POA: Diagnosis not present

## 2020-08-23 DIAGNOSIS — R079 Chest pain, unspecified: Secondary | ICD-10-CM | POA: Diagnosis not present

## 2020-08-23 DIAGNOSIS — M79605 Pain in left leg: Secondary | ICD-10-CM | POA: Diagnosis not present

## 2020-08-23 DIAGNOSIS — E785 Hyperlipidemia, unspecified: Secondary | ICD-10-CM | POA: Diagnosis not present

## 2020-08-23 DIAGNOSIS — E871 Hypo-osmolality and hyponatremia: Secondary | ICD-10-CM | POA: Diagnosis not present

## 2020-08-27 DIAGNOSIS — R69 Illness, unspecified: Secondary | ICD-10-CM | POA: Diagnosis not present

## 2020-08-27 DIAGNOSIS — R110 Nausea: Secondary | ICD-10-CM | POA: Diagnosis not present

## 2020-08-27 DIAGNOSIS — R55 Syncope and collapse: Secondary | ICD-10-CM | POA: Diagnosis not present

## 2020-08-27 DIAGNOSIS — R42 Dizziness and giddiness: Secondary | ICD-10-CM | POA: Diagnosis not present

## 2020-08-27 DIAGNOSIS — Z743 Need for continuous supervision: Secondary | ICD-10-CM | POA: Diagnosis not present

## 2020-09-13 DIAGNOSIS — Z87891 Personal history of nicotine dependence: Secondary | ICD-10-CM | POA: Diagnosis not present

## 2020-09-13 DIAGNOSIS — I739 Peripheral vascular disease, unspecified: Secondary | ICD-10-CM | POA: Diagnosis not present

## 2020-09-19 DIAGNOSIS — S8266XA Nondisplaced fracture of lateral malleolus of unspecified fibula, initial encounter for closed fracture: Secondary | ICD-10-CM | POA: Diagnosis not present

## 2020-09-29 DIAGNOSIS — S82892A Other fracture of left lower leg, initial encounter for closed fracture: Secondary | ICD-10-CM | POA: Diagnosis not present

## 2020-09-29 DIAGNOSIS — I10 Essential (primary) hypertension: Secondary | ICD-10-CM | POA: Diagnosis not present

## 2020-09-29 DIAGNOSIS — J302 Other seasonal allergic rhinitis: Secondary | ICD-10-CM | POA: Diagnosis not present

## 2020-10-04 DIAGNOSIS — S93402D Sprain of unspecified ligament of left ankle, subsequent encounter: Secondary | ICD-10-CM | POA: Diagnosis not present

## 2020-10-04 DIAGNOSIS — M8580 Other specified disorders of bone density and structure, unspecified site: Secondary | ICD-10-CM | POA: Diagnosis not present

## 2020-10-04 DIAGNOSIS — M25372 Other instability, left ankle: Secondary | ICD-10-CM | POA: Diagnosis not present

## 2020-10-25 DIAGNOSIS — E871 Hypo-osmolality and hyponatremia: Secondary | ICD-10-CM | POA: Diagnosis not present

## 2020-10-25 DIAGNOSIS — R0789 Other chest pain: Secondary | ICD-10-CM | POA: Diagnosis not present

## 2020-10-25 DIAGNOSIS — Z9049 Acquired absence of other specified parts of digestive tract: Secondary | ICD-10-CM | POA: Diagnosis not present

## 2020-10-25 DIAGNOSIS — R0781 Pleurodynia: Secondary | ICD-10-CM | POA: Diagnosis not present

## 2020-10-25 DIAGNOSIS — K219 Gastro-esophageal reflux disease without esophagitis: Secondary | ICD-10-CM | POA: Diagnosis not present

## 2020-10-25 DIAGNOSIS — I10 Essential (primary) hypertension: Secondary | ICD-10-CM | POA: Diagnosis not present

## 2020-10-25 DIAGNOSIS — R1110 Vomiting, unspecified: Secondary | ICD-10-CM | POA: Diagnosis not present

## 2020-10-25 DIAGNOSIS — G8911 Acute pain due to trauma: Secondary | ICD-10-CM | POA: Diagnosis not present

## 2020-10-25 DIAGNOSIS — W1830XA Fall on same level, unspecified, initial encounter: Secondary | ICD-10-CM | POA: Diagnosis not present

## 2020-10-25 DIAGNOSIS — Z743 Need for continuous supervision: Secondary | ICD-10-CM | POA: Diagnosis not present

## 2020-10-25 DIAGNOSIS — W19XXXA Unspecified fall, initial encounter: Secondary | ICD-10-CM | POA: Diagnosis not present

## 2020-10-25 DIAGNOSIS — R69 Illness, unspecified: Secondary | ICD-10-CM | POA: Diagnosis not present

## 2020-10-25 DIAGNOSIS — S2239XA Fracture of one rib, unspecified side, initial encounter for closed fracture: Secondary | ICD-10-CM | POA: Diagnosis not present

## 2020-10-25 DIAGNOSIS — J939 Pneumothorax, unspecified: Secondary | ICD-10-CM | POA: Diagnosis not present

## 2020-10-25 DIAGNOSIS — R519 Headache, unspecified: Secondary | ICD-10-CM | POA: Diagnosis not present

## 2020-10-25 DIAGNOSIS — R52 Pain, unspecified: Secondary | ICD-10-CM | POA: Diagnosis not present

## 2020-10-25 DIAGNOSIS — S2241XA Multiple fractures of ribs, right side, initial encounter for closed fracture: Secondary | ICD-10-CM | POA: Diagnosis not present

## 2020-10-26 DIAGNOSIS — S2241XA Multiple fractures of ribs, right side, initial encounter for closed fracture: Secondary | ICD-10-CM | POA: Diagnosis not present

## 2020-10-27 DIAGNOSIS — S2239XA Fracture of one rib, unspecified side, initial encounter for closed fracture: Secondary | ICD-10-CM | POA: Diagnosis not present

## 2020-10-28 DIAGNOSIS — S270XXA Traumatic pneumothorax, initial encounter: Secondary | ICD-10-CM | POA: Diagnosis not present

## 2020-10-28 DIAGNOSIS — W19XXXA Unspecified fall, initial encounter: Secondary | ICD-10-CM | POA: Diagnosis not present

## 2020-10-28 DIAGNOSIS — S2239XA Fracture of one rib, unspecified side, initial encounter for closed fracture: Secondary | ICD-10-CM | POA: Diagnosis not present

## 2020-10-28 DIAGNOSIS — S2241XA Multiple fractures of ribs, right side, initial encounter for closed fracture: Secondary | ICD-10-CM | POA: Diagnosis not present

## 2020-10-28 DIAGNOSIS — R0789 Other chest pain: Secondary | ICD-10-CM | POA: Diagnosis not present

## 2020-11-01 DIAGNOSIS — S2249XA Multiple fractures of ribs, unspecified side, initial encounter for closed fracture: Secondary | ICD-10-CM | POA: Diagnosis not present

## 2020-11-04 DIAGNOSIS — R69 Illness, unspecified: Secondary | ICD-10-CM | POA: Diagnosis not present

## 2020-11-04 DIAGNOSIS — S2241XD Multiple fractures of ribs, right side, subsequent encounter for fracture with routine healing: Secondary | ICD-10-CM | POA: Diagnosis not present

## 2020-11-04 DIAGNOSIS — I10 Essential (primary) hypertension: Secondary | ICD-10-CM | POA: Diagnosis not present

## 2020-11-04 DIAGNOSIS — I739 Peripheral vascular disease, unspecified: Secondary | ICD-10-CM | POA: Diagnosis not present

## 2020-11-04 DIAGNOSIS — S270XXD Traumatic pneumothorax, subsequent encounter: Secondary | ICD-10-CM | POA: Diagnosis not present

## 2020-11-04 DIAGNOSIS — N9489 Other specified conditions associated with female genital organs and menstrual cycle: Secondary | ICD-10-CM | POA: Diagnosis not present

## 2020-11-04 DIAGNOSIS — S2231XA Fracture of one rib, right side, initial encounter for closed fracture: Secondary | ICD-10-CM | POA: Diagnosis not present

## 2020-11-04 DIAGNOSIS — R4181 Age-related cognitive decline: Secondary | ICD-10-CM | POA: Diagnosis not present

## 2020-11-04 DIAGNOSIS — E785 Hyperlipidemia, unspecified: Secondary | ICD-10-CM | POA: Diagnosis not present

## 2020-11-04 DIAGNOSIS — I2510 Atherosclerotic heart disease of native coronary artery without angina pectoris: Secondary | ICD-10-CM | POA: Diagnosis not present

## 2020-11-04 DIAGNOSIS — E871 Hypo-osmolality and hyponatremia: Secondary | ICD-10-CM | POA: Diagnosis not present

## 2020-11-21 DIAGNOSIS — K5730 Diverticulosis of large intestine without perforation or abscess without bleeding: Secondary | ICD-10-CM | POA: Diagnosis not present

## 2020-11-21 DIAGNOSIS — Z743 Need for continuous supervision: Secondary | ICD-10-CM | POA: Diagnosis not present

## 2020-11-21 DIAGNOSIS — N200 Calculus of kidney: Secondary | ICD-10-CM | POA: Diagnosis not present

## 2020-11-21 DIAGNOSIS — R69 Illness, unspecified: Secondary | ICD-10-CM | POA: Diagnosis not present

## 2020-11-21 DIAGNOSIS — R1032 Left lower quadrant pain: Secondary | ICD-10-CM | POA: Diagnosis not present

## 2020-11-21 DIAGNOSIS — R1012 Left upper quadrant pain: Secondary | ICD-10-CM | POA: Diagnosis not present

## 2020-11-21 DIAGNOSIS — D259 Leiomyoma of uterus, unspecified: Secondary | ICD-10-CM | POA: Diagnosis not present

## 2020-11-21 DIAGNOSIS — R109 Unspecified abdominal pain: Secondary | ICD-10-CM | POA: Diagnosis not present

## 2020-11-23 DIAGNOSIS — R52 Pain, unspecified: Secondary | ICD-10-CM | POA: Diagnosis not present

## 2020-11-23 DIAGNOSIS — N9489 Other specified conditions associated with female genital organs and menstrual cycle: Secondary | ICD-10-CM | POA: Diagnosis not present

## 2020-11-23 DIAGNOSIS — K5900 Constipation, unspecified: Secondary | ICD-10-CM | POA: Diagnosis not present

## 2020-11-28 DIAGNOSIS — R102 Pelvic and perineal pain: Secondary | ICD-10-CM | POA: Diagnosis not present

## 2020-11-28 DIAGNOSIS — D259 Leiomyoma of uterus, unspecified: Secondary | ICD-10-CM | POA: Diagnosis not present

## 2020-11-28 DIAGNOSIS — R1900 Intra-abdominal and pelvic swelling, mass and lump, unspecified site: Secondary | ICD-10-CM | POA: Diagnosis not present

## 2020-12-06 DIAGNOSIS — N83202 Unspecified ovarian cyst, left side: Secondary | ICD-10-CM | POA: Diagnosis not present

## 2020-12-12 ENCOUNTER — Telehealth: Payer: Self-pay | Admitting: *Deleted

## 2020-12-12 NOTE — Telephone Encounter (Signed)
Called and spoke with the patient, scheduled a new patient appt with Dr Berline Lopes on 10/31 at 11:15 am. Patient given the address and phone number for the clinic, along with the policy for mask and visitors

## 2020-12-23 ENCOUNTER — Encounter: Payer: Self-pay | Admitting: Gynecologic Oncology

## 2020-12-26 ENCOUNTER — Inpatient Hospital Stay (HOSPITAL_BASED_OUTPATIENT_CLINIC_OR_DEPARTMENT_OTHER): Payer: Medicare HMO | Admitting: Gynecologic Oncology

## 2020-12-26 ENCOUNTER — Inpatient Hospital Stay: Payer: Medicare HMO | Attending: Gynecologic Oncology

## 2020-12-26 ENCOUNTER — Encounter: Payer: Self-pay | Admitting: Gynecologic Oncology

## 2020-12-26 ENCOUNTER — Other Ambulatory Visit: Payer: Self-pay

## 2020-12-26 VITALS — BP 179/72 | HR 92 | Temp 98.0°F | Resp 18 | Ht 64.0 in | Wt 132.0 lb

## 2020-12-26 DIAGNOSIS — I739 Peripheral vascular disease, unspecified: Secondary | ICD-10-CM | POA: Insufficient documentation

## 2020-12-26 DIAGNOSIS — R971 Elevated cancer antigen 125 [CA 125]: Secondary | ICD-10-CM | POA: Diagnosis not present

## 2020-12-26 DIAGNOSIS — Z803 Family history of malignant neoplasm of breast: Secondary | ICD-10-CM | POA: Diagnosis not present

## 2020-12-26 DIAGNOSIS — Z87891 Personal history of nicotine dependence: Secondary | ICD-10-CM | POA: Insufficient documentation

## 2020-12-26 DIAGNOSIS — I10 Essential (primary) hypertension: Secondary | ICD-10-CM | POA: Diagnosis not present

## 2020-12-26 DIAGNOSIS — D398 Neoplasm of uncertain behavior of other specified female genital organs: Secondary | ICD-10-CM | POA: Diagnosis not present

## 2020-12-26 DIAGNOSIS — E785 Hyperlipidemia, unspecified: Secondary | ICD-10-CM | POA: Insufficient documentation

## 2020-12-26 DIAGNOSIS — J439 Emphysema, unspecified: Secondary | ICD-10-CM | POA: Insufficient documentation

## 2020-12-26 DIAGNOSIS — R978 Other abnormal tumor markers: Secondary | ICD-10-CM

## 2020-12-26 DIAGNOSIS — Z79899 Other long term (current) drug therapy: Secondary | ICD-10-CM | POA: Diagnosis not present

## 2020-12-26 DIAGNOSIS — N9489 Other specified conditions associated with female genital organs and menstrual cycle: Secondary | ICD-10-CM

## 2020-12-26 LAB — CEA (IN HOUSE-CHCC): CEA (CHCC-In House): 1.75 ng/mL (ref 0.00–5.00)

## 2020-12-26 NOTE — Progress Notes (Signed)
GYNECOLOGIC ONCOLOGY NEW PATIENT CONSULTATION   Patient Name: Carol Reyes  Patient Age: 80 y.o. Date of Service: 12/26/20 Referring Provider: Dr. Penni Homans  Primary Care Provider: System, Provider Not In Consulting Provider: Jeral Pinch, MD   Assessment/Plan:  Postmenopausal patient with complex adnexal mass and elevated CA-125 and OVA1.  Reviewed imaging results with the patient and her son. Unfortunately, I don't have access to outside imaging (including CT and ultrasound). I discussed the limitations of both imaging modalities. Given my exam findings, I recommend that we proceed with MRI to help better elucidate the origin of this mass. Other than her initial episode of pain, the patient has been relatively asymptomatic. She has noted some change in the caliber of her stool and given exam, I worry that there is close proximity if not direct involvement of this mass with her sigmoid/rectum. She has documented diverticular disease which may complicate any future surgery depending on local of this mass and if it requires colon resection.  Will plan to get a CEA today.   She has some cardiac history and had an ECHO earlier this year. I have asked the patient to reach out to her PCP's office for surgical clearance. I will also have my office send a request for clearance.   If this mass involves her ovary and we proceed with surgery, I recommend at minimal bilateral salpingo-oophorectomy. This would likely be possible in a minimally invasive manner. I have asked her to think about whether she would be willing to consider adjuvant treatment if this is ultimately found to be a gyn cancer. This is important in consideration of procedures to be performed at the time of surgery, goals of surgery, and any effort to decrease time of surgery and associated morbidity.   We will tentatively plan for surgery later this month. The patient was scheduled for an MRI in a little over a week. I  will call her with these results and we will make a final plan in terms of surgery.  Surgery was scheduled today for 11/17. We will have her return for preoperative teaching if/when we decide on final surgical plan.   A copy of this note was sent to the patient's referring provider.   85 minutes of total time was spent for this patient encounter, including preparation, face-to-face counseling with the patient and coordination of care, and documentation of the encounter.   Jeral Pinch, MD  Division of Gynecologic Oncology  Department of Obstetrics and Gynecology  [facility]  ___________________________________________  Chief Complaint: Chief Complaint  Patient presents with   Adnexal mass   Elevated tumor markers    History of Present Illness:  Carol Reyes is a 80 y.o. y.o. female who is seen in consultation at the request of Dr. Jodelle Red for an evaluation of a complex adnexal mass.  The records are not available, but the patient reports that while she was in [facility] in September, she developed several days of left-sided pelvic pain that she describes as sharp.  This became painful enough that she ultimately went to the hospital for further evaluation.  She underwent CT scan as well as pelvic ultrasound.  Shortly after being evaluated at the hospital, she reports cessation of the pain and has not had any since.  She does not remember having similar pain before.  She endorses a good appetite without nausea or emesis.  She denies any weight loss or early satiety.  She has struggled with constipation  previously, uses MiraLAX and Metamucil as needed.  More recently, she notes that her bowel function has been more regular although the caliber of her stool has gotten more thin.  She denies any urinary symptoms.  She has a long history of tobacco use, quit 6 or 7 years ago but had a 40-year history.  Denies any shortness of breath or chest pain unless she  gets sick.  She drinks at least several beers every night.  She lives in [place] but has a place in [place] and spends a lot of her time there.  Family history is notable for breast cancer in her mother paternal aunt and mother.  Patient denies having a cardiologist but had an echo in outside records that we received in May of this year showing an EF of 60-65% with normal atrial sizes, mild to moderate mitral regurgitation with normal LV end-diastolic dimension.  Mild to moderate tricuspid regurgitation.  PAST MEDICAL HISTORY:  Past Medical History:  Diagnosis Date   Age-related cognitive decline    Alcohol use disorder    Allergy    Basal cell carcinoma (BCC) of face    Left side   CAD (coronary artery disease), native coronary artery    without angina pectoris   Chronic fatigue    Chronic hyponatremia    Diverticulosis of colon    Hyperlipemia    Hypertension    Melanoma of foot, unspecified laterality (HCC)    PAD (peripheral artery disease) (HCC)    Pulmonary emphysema, unspecified emphysema type (HCC)    Pulmonary nodule, right      PAST SURGICAL HISTORY:  Past Surgical History:  Procedure Laterality Date   APPENDECTOMY     BASAL CELL CARCINOMA EXCISION Left    face   MELANOMA EXCISION Left    foot    OB/GYN HISTORY:  OB History  Gravida Para Term Preterm AB Living  1 1          SAB IAB Ectopic Multiple Live Births               # Outcome Date GA Lbr Len/2nd Weight Sex Delivery Anes PTL Lv  1 Para             No LMP recorded.  Age at menarche: 78 Age at menopause: 53 Hx of HRT: Denies Hx of STDs: Denies Last pap: Thinks approximately 5 years ago History of abnormal pap smears: Denies  SCREENING STUDIES:  Last mammogram: 2019  Last colonoscopy: Approximately 5 years ago  MEDICATIONS: Outpatient Encounter Medications as of 12/26/2020  Medication Sig   amLODipine (NORVASC) 2.5 MG tablet Take 2.5 mg by mouth daily.   [employer] PURELAX 17 g  packet Take by mouth daily as needed.   diclofenac (VOLTAREN) 75 MG EC tablet Take 75 mg by mouth 2 (two) times daily.   HYDROcodone-acetaminophen (NORCO) 7.5-325 MG tablet Take 1 tablet by mouth every 6 (six) hours as needed.   lidocaine (LIDODERM) 5 % SMARTSIG:1 Patch(s) Topical Every 8 Hours   [DISCONTINUED] rosuvastatin (CRESTOR) 5 MG tablet Take 5 mg by mouth daily.   No facility-administered encounter medications on file as of 12/26/2020.    ALLERGIES:  No Known Allergies   FAMILY HISTORY:  Family History  Problem Relation Age of Onset   Breast cancer Mother    Colon cancer Neg Hx    Ovarian cancer Neg Hx    Endometrial cancer Neg Hx    Pancreatic cancer Neg Hx  Prostate cancer Neg Hx      SOCIAL HISTORY:  Social Connections: Not on file    REVIEW OF SYSTEMS:  + join pain Denies appetite changes, fevers, chills, fatigue, unexplained weight changes. Denies hearing loss, neck lumps or masses, mouth sores, ringing in ears or voice changes. Denies cough or wheezing.  Denies shortness of breath. Denies chest pain or palpitations. Denies leg swelling. Denies abdominal distention, pain, blood in stools, constipation, diarrhea, nausea, vomiting, or early satiety. Denies pain with intercourse, dysuria, frequency, hematuria or incontinence. Denies hot flashes, pelvic pain, vaginal bleeding or vaginal discharge.   Denies back pain or muscle pain/cramps. Denies itching, rash, or wounds. Denies dizziness, headaches, numbness or seizures. Denies swollen lymph nodes or glands, denies easy bruising or bleeding. Denies anxiety, depression, confusion, or decreased concentration.  Physical Exam:  Vital Signs for this encounter:  Blood pressure (!) 179/72, pulse 92, temperature 98 F (36.7 C), temperature source Oral, resp. rate 18, height 5\' 4"  (1.626 m), weight 132 lb (59.9 kg), SpO2 100 %. Body mass index is 22.66 kg/m. General: Alert, oriented, no acute distress.  HEENT:  Normocephalic, atraumatic. Sclera anicteric.  Chest: Clear to auscultation bilaterally. No wheezes, rhonchi, or rales. Cardiovascular: Regular rate and rhythm, no murmurs, rubs, or gallops.  Abdomen: Normoactive bowel sounds. Soft, nondistended, nontender to palpation. No masses or hepatosplenomegaly appreciated. No palpable fluid wave.  Well-healed incision in right lower quadrant. Extremities: Grossly normal range of motion. Warm, well perfused. No edema bilaterally.  Skin: No rashes or lesions.  Lymphatics: No cervical, supraclavicular, or inguinal adenopathy.  GU:  Normal external female genitalia. No lesions. No discharge or bleeding.             Bladder/urethra:  No lesions or masses, well supported bladder             Vagina: Mildly atrophic, no lesions or masses.             Cervix: Normal appearing, no lesions.             Uterus: Small, mobile, no parametrial involvement or nodularity.             Adnexa: Firm, smooth mass appreciated in the left adnexa, does not appear attached or to move with the uterus, is in close proximity if not adherent to the rectum, although no evidence of rectal invasion on rectovaginal exam.  Rectal: See above findings.  LABORATORY AND RADIOLOGIC DATA:  Outside medical records were reviewed to synthesize the above history, along with the history and physical obtained during the visit.   No results found for: WBC, HGB, HCT, PLT, GLUCOSE, CHOL, TRIG, HDL, LDLDIRECT, LDLCALC, ALT, AST, NA, K, CL, CREATININE, BUN, CO2, TSH, PSA, INR, GLUF, HGBA1C, MICROALBUR  CA-125: 52.6  OVA1: 6.6, elevated risk for postmenopausal status  Pelvic ultrasound on 11/21/2020: Heterogenous 5.7 x 4.2 x 4.5 cm lesion in the left adnexa.  Uterine fibroids noted.  Endometrial lining not well seen due to fibroids.  Right ovary appears normal.  CT of the abdomen and pelvis on 11/21/2020: Homogeneous left adnexal lesion measuring 6.3 cm, possibly hemorrhagic cyst.  Calcified uterine  fibroid is present.  No adenopathy or free fluid noted.  Extensive descending and sigmoid colon diverticulosis noted.  Small right pleural effusion.  Nonobstructive right nephrolithiasis.  Pel[facility] at [facility] on 11/28/20: Uterus measures 2.6 x 3.9 x 2.6 cm with an endometrial lining of 2.7 mm.  Right ovary measures 2.1 x 1.4 x 1.3 cm.  Several fibroids noted measuring up to 2.9 cm in greatest dimension.  Normal left ovary not appreciated.  There is a complex structure in the left adnexa measuring 5.7 x 4.2 x 4.5 cm.  There is some blood flow noted within this lesion.  Some free fluid noted in the pelvis.

## 2020-12-26 NOTE — H&P (View-Only) (Signed)
GYNECOLOGIC ONCOLOGY NEW PATIENT CONSULTATION   Patient Name: Carol Reyes  Patient Age: 80 y.o. Date of Service: 12/26/20 Referring Provider: Dr. Penni Homans  Primary Care Provider: System, Provider Not In Consulting Provider: Jeral Pinch, MD   Assessment/Plan:  Postmenopausal patient with complex adnexal mass and elevated CA-125 and OVA1.  Reviewed imaging results with the patient and her son. Unfortunately, I don't have access to outside imaging (including CT and ultrasound). I discussed the limitations of both imaging modalities. Given my exam findings, I recommend that we proceed with MRI to help better elucidate the origin of this mass. Other than her initial episode of pain, the patient has been relatively asymptomatic. She has noted some change in the caliber of her stool and given exam, I worry that there is close proximity if not direct involvement of this mass with her sigmoid/rectum. She has documented diverticular disease which may complicate any future surgery depending on local of this mass and if it requires colon resection.  Will plan to get a CEA today.   She has some cardiac history and had an ECHO earlier this year. I have asked the patient to reach out to her PCP's office for surgical clearance. I will also have my office send a request for clearance.   If this mass involves her ovary and we proceed with surgery, I recommend at minimal bilateral salpingo-oophorectomy. This would likely be possible in a minimally invasive manner. I have asked her to think about whether she would be willing to consider adjuvant treatment if this is ultimately found to be a gyn cancer. This is important in consideration of procedures to be performed at the time of surgery, goals of surgery, and any effort to decrease time of surgery and associated morbidity.   We will tentatively plan for surgery later this month. The patient was scheduled for an MRI in a little over a week. I  will call her with these results and we will make a final plan in terms of surgery.  Surgery was scheduled today for 11/17. We will have her return for preoperative teaching if/when we decide on final surgical plan.   A copy of this note was sent to the patient's referring provider.   85 minutes of total time was spent for this patient encounter, including preparation, face-to-face counseling with the patient and coordination of care, and documentation of the encounter.   Jeral Pinch, MD  Division of Gynecologic Oncology  Department of Obstetrics and Gynecology  [facility]  ___________________________________________  Chief Complaint: Chief Complaint  Patient presents with   Adnexal mass   Elevated tumor markers    History of Present Illness:  Carol Reyes is a 80 y.o. y.o. female who is seen in consultation at the request of Dr. Jodelle Red for an evaluation of a complex adnexal mass.  The records are not available, but the patient reports that while she was in [facility] in September, she developed several days of left-sided pelvic pain that she describes as sharp.  This became painful enough that she ultimately went to the hospital for further evaluation.  She underwent CT scan as well as pelvic ultrasound.  Shortly after being evaluated at the hospital, she reports cessation of the pain and has not had any since.  She does not remember having similar pain before.  She endorses a good appetite without nausea or emesis.  She denies any weight loss or early satiety.  She has struggled with constipation  previously, uses MiraLAX and Metamucil as needed.  More recently, she notes that her bowel function has been more regular although the caliber of her stool has gotten more thin.  She denies any urinary symptoms.  She has a long history of tobacco use, quit 6 or 7 years ago but had a 40-year history.  Denies any shortness of breath or chest pain unless she  gets sick.  She drinks at least several beers every night.  She lives in [place] but has a place in [place] and spends a lot of her time there.  Family history is notable for breast cancer in her mother paternal aunt and mother.  Patient denies having a cardiologist but had an echo in outside records that we received in May of this year showing an EF of 60-65% with normal atrial sizes, mild to moderate mitral regurgitation with normal LV end-diastolic dimension.  Mild to moderate tricuspid regurgitation.  PAST MEDICAL HISTORY:  Past Medical History:  Diagnosis Date   Age-related cognitive decline    Alcohol use disorder    Allergy    Basal cell carcinoma (BCC) of face    Left side   CAD (coronary artery disease), native coronary artery    without angina pectoris   Chronic fatigue    Chronic hyponatremia    Diverticulosis of colon    Hyperlipemia    Hypertension    Melanoma of foot, unspecified laterality (HCC)    PAD (peripheral artery disease) (HCC)    Pulmonary emphysema, unspecified emphysema type (HCC)    Pulmonary nodule, right      PAST SURGICAL HISTORY:  Past Surgical History:  Procedure Laterality Date   APPENDECTOMY     BASAL CELL CARCINOMA EXCISION Left    face   MELANOMA EXCISION Left    foot    OB/GYN HISTORY:  OB History  Gravida Para Term Preterm AB Living  1 1          SAB IAB Ectopic Multiple Live Births               # Outcome Date GA Lbr Len/2nd Weight Sex Delivery Anes PTL Lv  1 Para             No LMP recorded.  Age at menarche: 3 Age at menopause: 3 Hx of HRT: Denies Hx of STDs: Denies Last pap: Thinks approximately 5 years ago History of abnormal pap smears: Denies  SCREENING STUDIES:  Last mammogram: 2019  Last colonoscopy: Approximately 5 years ago  MEDICATIONS: Outpatient Encounter Medications as of 12/26/2020  Medication Sig   amLODipine (NORVASC) 2.5 MG tablet Take 2.5 mg by mouth daily.   [employer] PURELAX 17 g  packet Take by mouth daily as needed.   diclofenac (VOLTAREN) 75 MG EC tablet Take 75 mg by mouth 2 (two) times daily.   HYDROcodone-acetaminophen (NORCO) 7.5-325 MG tablet Take 1 tablet by mouth every 6 (six) hours as needed.   lidocaine (LIDODERM) 5 % SMARTSIG:1 Patch(s) Topical Every 8 Hours   [DISCONTINUED] rosuvastatin (CRESTOR) 5 MG tablet Take 5 mg by mouth daily.   No facility-administered encounter medications on file as of 12/26/2020.    ALLERGIES:  No Known Allergies   FAMILY HISTORY:  Family History  Problem Relation Age of Onset   Breast cancer Mother    Colon cancer Neg Hx    Ovarian cancer Neg Hx    Endometrial cancer Neg Hx    Pancreatic cancer Neg Hx  Prostate cancer Neg Hx      SOCIAL HISTORY:  Social Connections: Not on file    REVIEW OF SYSTEMS:  + join pain Denies appetite changes, fevers, chills, fatigue, unexplained weight changes. Denies hearing loss, neck lumps or masses, mouth sores, ringing in ears or voice changes. Denies cough or wheezing.  Denies shortness of breath. Denies chest pain or palpitations. Denies leg swelling. Denies abdominal distention, pain, blood in stools, constipation, diarrhea, nausea, vomiting, or early satiety. Denies pain with intercourse, dysuria, frequency, hematuria or incontinence. Denies hot flashes, pelvic pain, vaginal bleeding or vaginal discharge.   Denies back pain or muscle pain/cramps. Denies itching, rash, or wounds. Denies dizziness, headaches, numbness or seizures. Denies swollen lymph nodes or glands, denies easy bruising or bleeding. Denies anxiety, depression, confusion, or decreased concentration.  Physical Exam:  Vital Signs for this encounter:  Blood pressure (!) 179/72, pulse 92, temperature 98 F (36.7 C), temperature source Oral, resp. rate 18, height 5\' 4"  (1.626 m), weight 132 lb (59.9 kg), SpO2 100 %. Body mass index is 22.66 kg/m. General: Alert, oriented, no acute distress.  HEENT:  Normocephalic, atraumatic. Sclera anicteric.  Chest: Clear to auscultation bilaterally. No wheezes, rhonchi, or rales. Cardiovascular: Regular rate and rhythm, no murmurs, rubs, or gallops.  Abdomen: Normoactive bowel sounds. Soft, nondistended, nontender to palpation. No masses or hepatosplenomegaly appreciated. No palpable fluid wave.  Well-healed incision in right lower quadrant. Extremities: Grossly normal range of motion. Warm, well perfused. No edema bilaterally.  Skin: No rashes or lesions.  Lymphatics: No cervical, supraclavicular, or inguinal adenopathy.  GU:  Normal external female genitalia. No lesions. No discharge or bleeding.             Bladder/urethra:  No lesions or masses, well supported bladder             Vagina: Mildly atrophic, no lesions or masses.             Cervix: Normal appearing, no lesions.             Uterus: Small, mobile, no parametrial involvement or nodularity.             Adnexa: Firm, smooth mass appreciated in the left adnexa, does not appear attached or to move with the uterus, is in close proximity if not adherent to the rectum, although no evidence of rectal invasion on rectovaginal exam.  Rectal: See above findings.  LABORATORY AND RADIOLOGIC DATA:  Outside medical records were reviewed to synthesize the above history, along with the history and physical obtained during the visit.   No results found for: WBC, HGB, HCT, PLT, GLUCOSE, CHOL, TRIG, HDL, LDLDIRECT, LDLCALC, ALT, AST, NA, K, CL, CREATININE, BUN, CO2, TSH, PSA, INR, GLUF, HGBA1C, MICROALBUR  CA-125: 52.6  OVA1: 6.6, elevated risk for postmenopausal status  Pelvic ultrasound on 11/21/2020: Heterogenous 5.7 x 4.2 x 4.5 cm lesion in the left adnexa.  Uterine fibroids noted.  Endometrial lining not well seen due to fibroids.  Right ovary appears normal.  CT of the abdomen and pelvis on 11/21/2020: Homogeneous left adnexal lesion measuring 6.3 cm, possibly hemorrhagic cyst.  Calcified uterine  fibroid is present.  No adenopathy or free fluid noted.  Extensive descending and sigmoid colon diverticulosis noted.  Small right pleural effusion.  Nonobstructive right nephrolithiasis.  Pelvic ultrasound at [facility] on 11/28/20: Uterus measures 2.6 x 3.9 x 2.6 cm with an endometrial lining of 2.7 mm.  Right ovary measures 2.1 x 1.4 x 1.3 cm.  Several fibroids noted measuring up to 2.9 cm in greatest dimension.  Normal left ovary not appreciated.  There is a complex structure in the left adnexa measuring 5.7 x 4.2 x 4.5 cm.  There is some blood flow noted within this lesion.  Some free fluid noted in the pelvis.

## 2020-12-26 NOTE — Patient Instructions (Addendum)
It was a pleasure meeting you today.  We will get lab test today (CEA) and MRI as scheduled on the seventh.  I will call you once I get the results back and we will discuss next steps.  We have tentatively pick a date for surgery today and if surgery is indicated and I am the best person to do this, we will move forward on the day that we have already scheduled.  We will tentatively schedule your surgery for January 12, 2021. Based on the MRI results, if Dr. Berline Lopes feels surgery is necessary and she is the correct person to perform this, we will have you come back to the office to meet with [name] NP to discuss the surgery in detail including what the expect before and after. You will also receive a phone call about having a preop appt at the hospital. We can normally do both of these appointments on the same day.

## 2020-12-28 ENCOUNTER — Encounter (HOSPITAL_COMMUNITY)
Admission: RE | Admit: 2020-12-28 | Discharge: 2020-12-28 | Disposition: A | Discharge status: Home / Self Care | Payer: Medicare HMO | Source: Ambulatory Visit | Attending: Anesthesiology | Admitting: Anesthesiology

## 2020-12-28 ENCOUNTER — Telehealth: Payer: Self-pay | Admitting: *Deleted

## 2020-12-28 ENCOUNTER — Telehealth: Payer: Self-pay

## 2020-12-28 NOTE — Telephone Encounter (Signed)
Moved the pre admission app from 11/2 to 11/11, patient aware

## 2020-12-28 NOTE — Progress Notes (Signed)
COVID swab appointment:  COVID Vaccine Completed: Date COVID Vaccine completed: Has received booster: COVID vaccine manufacturer: [employer]   Date of COVID positive in last 90 days:  PCP -  Cardiologist -   Chest x-ray -  EKG -  Stress Test -  ECHO -  Cardiac Cath -  Pacemaker/ICD device last checked: Spinal Cord Stimulator:  Sleep Study -  CPAP -   Fasting Blood Sugar -  Checks Blood Sugar _____ times a day  Blood Thinner Instructions: Aspirin Instructions: Last Dose:  Activity level:  Can go up a flight of stairs and perform activities of daily living without stopping and without symptoms of chest pain or shortness of breath.   Able to exercise without symptoms  Unable to go up a flight of stairs without symptoms of      Anesthesia review: CAD, HTN, pulmonary emphysema, CP  Patient denies shortness of breath, fever, cough and chest pain at PAT appointment   Patient verbalized understanding of instructions that were given to them at the PAT appointment. Patient was also instructed that they will need to review over the PAT instructions again at home before surgery.

## 2020-12-28 NOTE — Telephone Encounter (Signed)
Discussed CEA lab result from 12/26/20 with Carol Reyes. Per Dr. Berline Lopes CEA is normal. Test result is 1.75, normal range is 0.00-5.00. Patient verbalized understanding and is happy with news. Instructed patient that once our office gets her MRI results from 01/02/21 we will be in touch in with her. Instructed to call with any questions or concerns.

## 2020-12-30 DIAGNOSIS — Z6821 Body mass index (BMI) 21.0-21.9, adult: Secondary | ICD-10-CM | POA: Diagnosis not present

## 2020-12-30 DIAGNOSIS — Z789 Other specified health status: Secondary | ICD-10-CM | POA: Diagnosis not present

## 2020-12-30 DIAGNOSIS — M25551 Pain in right hip: Secondary | ICD-10-CM | POA: Diagnosis not present

## 2020-12-30 DIAGNOSIS — Z23 Encounter for immunization: Secondary | ICD-10-CM | POA: Diagnosis not present

## 2020-12-30 DIAGNOSIS — S2239XA Fracture of one rib, unspecified side, initial encounter for closed fracture: Secondary | ICD-10-CM | POA: Diagnosis not present

## 2021-01-02 ENCOUNTER — Ambulatory Visit (HOSPITAL_COMMUNITY)
Admission: RE | Admit: 2021-01-02 | Discharge: 2021-01-02 | Disposition: A | Discharge status: Home / Self Care | Payer: Medicare HMO | Source: Ambulatory Visit | Attending: Gynecologic Oncology | Admitting: Gynecologic Oncology

## 2021-01-02 ENCOUNTER — Other Ambulatory Visit: Payer: Self-pay | Admitting: Gynecologic Oncology

## 2021-01-02 ENCOUNTER — Other Ambulatory Visit: Payer: Self-pay

## 2021-01-02 DIAGNOSIS — Z780 Asymptomatic menopausal state: Secondary | ICD-10-CM | POA: Diagnosis not present

## 2021-01-02 DIAGNOSIS — N9489 Other specified conditions associated with female genital organs and menstrual cycle: Secondary | ICD-10-CM

## 2021-01-02 DIAGNOSIS — K5730 Diverticulosis of large intestine without perforation or abscess without bleeding: Secondary | ICD-10-CM | POA: Diagnosis not present

## 2021-01-02 DIAGNOSIS — R978 Other abnormal tumor markers: Secondary | ICD-10-CM | POA: Insufficient documentation

## 2021-01-02 DIAGNOSIS — D259 Leiomyoma of uterus, unspecified: Secondary | ICD-10-CM | POA: Diagnosis not present

## 2021-01-02 DIAGNOSIS — S3210XA Unspecified fracture of sacrum, initial encounter for closed fracture: Secondary | ICD-10-CM | POA: Diagnosis not present

## 2021-01-04 ENCOUNTER — Telehealth: Payer: Self-pay | Admitting: *Deleted

## 2021-01-04 ENCOUNTER — Telehealth: Payer: Self-pay | Admitting: Gynecologic Oncology

## 2021-01-04 DIAGNOSIS — Z85820 Personal history of malignant melanoma of skin: Secondary | ICD-10-CM | POA: Diagnosis not present

## 2021-01-04 DIAGNOSIS — Z08 Encounter for follow-up examination after completed treatment for malignant neoplasm: Secondary | ICD-10-CM | POA: Diagnosis not present

## 2021-01-04 DIAGNOSIS — Z1283 Encounter for screening for malignant neoplasm of skin: Secondary | ICD-10-CM | POA: Diagnosis not present

## 2021-01-04 NOTE — Telephone Encounter (Signed)
I spoke with the patient as well as her son.  Reviewed MRI results.  Unfortunately, the patient refused contrast which makes the MRI limited.  Patient seemed very surprised regarding the plan that we made at the time of her visit for surgery on the 17th as well as appointment that she has on the 11 for preanesthesia.  I offered to move this until early in the following week, but she asked me to call her son.  Spoke with son.  He is aware of appointment on the 11th as well as plan for surgery on the 17th.  I reiterated the importance of appointment this Friday.  We will also have them come for a preoperative visit in our clinic.  Discussed MRI findings with him as well, reviewed plan for BSO at minimum, additional procedures to be dictated by findings at the time of surgery.  Jeral Pinch MD Gynecologic Oncology

## 2021-01-04 NOTE — Telephone Encounter (Signed)
Spoke with the patient and was given Carol Dare, NP has her PCP. Patient sated that she does not have a cardiologist and can't remember where she had the ECHO done.   Request for surgical optimization and records sent to the patient's PCP. Along with the request for a copy of the ECHO report.

## 2021-01-05 NOTE — Patient Instructions (Addendum)
DUE TO COVID-19 ONLY ONE VISITOR IS ALLOWED TO COME WITH YOU AND STAY IN THE WAITING ROOM ONLY DURING PRE OP AND PROCEDURE.   **NO VISITORS ARE ALLOWED IN THE SHORT STAY AREA OR RECOVERY ROOM!!**       Your procedure is scheduled on: 01/12/21   Report to [facility] Main Entrance    Report to admitting at 9:30 AM   Call this number if you have problems the morning of surgery [phone]   Do not eat food :After Midnight.   May have liquids until 8:45 am day of surgery  CLEAR LIQUID DIET  Foods Allowed                                                                     Foods Excluded  Water, Black Coffee and tea (no milk or creamer)           liquids that you cannot  Plain Jell-O in any flavor  (No red)                                   see through such as: Fruit ices (not with fruit pulp)                                           milk, soups, orange juice              Iced Popsicles (No red)                                               All solid food                                   Apple juices Sports drinks like Gatorade (No red) Lightly seasoned clear broth or consume(fat free) Sugar    Oral Hygiene is also important to reduce your risk of infection.                                    Remember - BRUSH YOUR TEETH THE MORNING OF SURGERY WITH YOUR REGULAR TOOTHPASTE   Take these medicines the morning of surgery with A SIP OF WATER: Amlodipine, Omeprazole                              You may not have any metal on your body including hair pins, jewelry, and body piercing             Do not wear make-up, lotions, powders, perfumes, or deodorant  Do not wear nail polish including gel and S&S, artificial/acrylic nails, or any other type of covering on natural nails including finger and toenails. If you have artificial nails, gel coating, etc. that needs to be removed by  a nail salon please have this removed prior to surgery or surgery may need to be canceled/ delayed if  the surgeon/ anesthesia feels like they are unable to be safely monitored.   Do not shave  48 hours prior to surgery.    Do not bring valuables to the hospital. [place].   Contacts, dentures or bridgework may not be worn into surgery.    Patients discharged on the day of surgery will not be allowed to drive home.  Special Instructions: Bring a copy of your healthcare power of attorney and living will documents         the day of surgery if you haven't scanned them before.    Please read over the following fact sheets you were given: IF YOU HAVE QUESTIONS ABOUT YOUR PRE-OP INSTRUCTIONS PLEASE CALL [facility] - Preparing for Surgery Before surgery, you can play an important role.  Because skin is not sterile, your skin needs to be as free of germs as possible.  You can reduce the number of germs on your skin by washing with CHG (chlorahexidine gluconate) soap before surgery.  CHG is an antiseptic cleaner which kills germs and bonds with the skin to continue killing germs even after washing. Please DO NOT use if you have an allergy to CHG or antibacterial soaps.  If your skin becomes reddened/irritated stop using the CHG and inform your nurse when you arrive at Short Stay. Do not shave (including legs and underarms) for at least 48 hours prior to the first CHG shower.  You may shave your face/neck.  Please follow these instructions carefully:  1.  Shower with CHG Soap the night before surgery and the  morning of surgery.  2.  If you choose to wash your hair, wash your hair first as usual with your normal  shampoo.  3.  After you shampoo, rinse your hair and body thoroughly to remove the shampoo.                             4.  Use CHG as you would any other liquid soap.  You can apply chg directly to the skin and wash.  Gently with a scrungie or clean washcloth.  5.  Apply the CHG Soap to your body ONLY FROM THE NECK  DOWN.   Do   not use on face/ open                           Wound or open sores. Avoid contact with eyes, ears mouth and   genitals (private parts).                       Wash face,  Genitals (private parts) with your normal soap.             6.  Wash thoroughly, paying special attention to the area where your    surgery  will be performed.  7.  Thoroughly rinse your body with warm water from the neck down.  8.  DO NOT shower/wash with your normal soap after using and rinsing off the CHG Soap.                9.  Pat yourself dry with a clean towel.  10.  Wear clean pajamas.            11.  Place clean sheets on your bed the night of your first shower and do not  sleep with pets. Day of Surgery : Do not apply any lotions/deodorants the morning of surgery.  Please wear clean clothes to the hospital/surgery center.  FAILURE TO FOLLOW THESE INSTRUCTIONS MAY RESULT IN THE CANCELLATION OF YOUR SURGERY  PATIENT SIGNATURE_________________________________  NURSE SIGNATURE__________________________________  ________________________________________________________________________    WHAT IS A BLOOD TRANSFUSION? Blood Transfusion Information  A transfusion is the replacement of blood or some of its parts. Blood is made up of multiple cells which provide different functions. Red blood cells carry oxygen and are used for blood loss replacement. White blood cells fight against infection. Platelets control bleeding. Plasma helps clot blood. Other blood products are available for specialized needs, such as hemophilia or other clotting disorders. BEFORE THE TRANSFUSION  Who gives blood for transfusions?  Healthy volunteers who are fully evaluated to make sure their blood is safe. This is blood bank blood. Transfusion therapy is the safest it has ever been in the practice of medicine. Before blood is taken from a donor, a complete history is taken to make sure that person has no history of  diseases nor engages in risky social behavior (examples are intravenous drug use or sexual activity with multiple partners). The donor's travel history is screened to minimize risk of transmitting infections, such as malaria. The donated blood is tested for signs of infectious diseases, such as HIV and hepatitis. The blood is then tested to be sure it is compatible with you in order to minimize the chance of a transfusion reaction. If you or a relative donates blood, this is often done in anticipation of surgery and is not appropriate for emergency situations. It takes many days to process the donated blood. RISKS AND COMPLICATIONS Although transfusion therapy is very safe and saves many lives, the main dangers of transfusion include:  Getting an infectious disease. Developing a transfusion reaction. This is an allergic reaction to something in the blood you were given. Every precaution is taken to prevent this. The decision to have a blood transfusion has been considered carefully by your caregiver before blood is given. Blood is not given unless the benefits outweigh the risks. AFTER THE TRANSFUSION Right after receiving a blood transfusion, you will usually feel much better and more energetic. This is especially true if your red blood cells have gotten low (anemic). The transfusion raises the level of the red blood cells which carry oxygen, and this usually causes an energy increase. The nurse administering the transfusion will monitor you carefully for complications. HOME CARE INSTRUCTIONS  No special instructions are needed after a transfusion. You may find your energy is better. Speak with your caregiver about any limitations on activity for underlying diseases you may have. SEEK MEDICAL CARE IF:  Your condition is not improving after your transfusion. You develop redness or irritation at the intravenous (IV) site. SEEK IMMEDIATE MEDICAL CARE IF:  Any of the following symptoms occur over the  next 12 hours: Shaking chills. You have a temperature by mouth above 102 F (38.9 C), not controlled by medicine. Chest, back, or muscle pain. People around you feel you are not acting correctly or are confused. Shortness of breath or difficulty breathing. Dizziness and fainting. You get a rash or develop hives. You have a decrease in urine output. Your urine turns a dark color or changes  to pink, red, or brown. Any of the following symptoms occur over the next 10 days: You have a temperature by mouth above 102 F (38.9 C), not controlled by medicine. Shortness of breath. Weakness after normal activity. The white part of the eye turns yellow (jaundice). You have a decrease in the amount of urine or are urinating less often. Your urine turns a dark color or changes to pink, red, or brown. Document Released: 02/10/2000 Document Revised: 05/07/2011 Document Reviewed: 09/29/2007 [facility] Patient Information 2014 [place], [place].  _______________________________________________________________________

## 2021-01-06 ENCOUNTER — Encounter (HOSPITAL_COMMUNITY): Payer: Self-pay

## 2021-01-06 ENCOUNTER — Inpatient Hospital Stay: Payer: Medicare HMO | Attending: Gynecologic Oncology | Admitting: Gynecologic Oncology

## 2021-01-06 ENCOUNTER — Encounter (HOSPITAL_COMMUNITY)
Admission: RE | Admit: 2021-01-06 | Discharge: 2021-01-06 | Disposition: A | Discharge status: Home / Self Care | Payer: Medicare HMO | Source: Ambulatory Visit | Attending: Gynecologic Oncology | Admitting: Gynecologic Oncology

## 2021-01-06 ENCOUNTER — Other Ambulatory Visit: Payer: Self-pay

## 2021-01-06 VITALS — BP 124/58 | HR 73 | Temp 98.5°F | Resp 16 | Ht 64.0 in | Wt 131.8 lb

## 2021-01-06 DIAGNOSIS — Z01812 Encounter for preprocedural laboratory examination: Secondary | ICD-10-CM | POA: Diagnosis not present

## 2021-01-06 DIAGNOSIS — R978 Other abnormal tumor markers: Secondary | ICD-10-CM | POA: Insufficient documentation

## 2021-01-06 DIAGNOSIS — N9489 Other specified conditions associated with female genital organs and menstrual cycle: Secondary | ICD-10-CM | POA: Diagnosis not present

## 2021-01-06 HISTORY — DX: Pneumonia, unspecified organism: J18.9

## 2021-01-06 LAB — COMPREHENSIVE METABOLIC PANEL
ALT: 17 U/L (ref 0–44)
AST: 22 U/L (ref 15–41)
Albumin: 4.3 g/dL (ref 3.5–5.0)
Alkaline Phosphatase: 108 U/L (ref 38–126)
Anion gap: 9 (ref 5–15)
BUN: 15 mg/dL (ref 8–23)
CO2: 25 mmol/L (ref 22–32)
Calcium: 9.1 mg/dL (ref 8.9–10.3)
Chloride: 95 mmol/L — ABNORMAL LOW (ref 98–111)
Creatinine, Ser: 0.7 mg/dL (ref 0.44–1.00)
GFR, Estimated: >60 mL/min (ref >60)
Glucose, Bld: 86 mg/dL (ref 70–99)
Potassium: 4.3 mmol/L (ref 3.5–5.1)
Sodium: 129 mmol/L — ABNORMAL LOW (ref 135–145)
Total Bilirubin: 0.4 mg/dL (ref 0.3–1.2)
Total Protein: 7.4 g/dL (ref 6.5–8.1)

## 2021-01-06 LAB — CBC
HCT: 33.8 % — ABNORMAL LOW (ref 36.0–46.0)
Hemoglobin: 11.2 g/dL — ABNORMAL LOW (ref 12.0–15.0)
MCH: 30.8 pg (ref 26.0–34.0)
MCHC: 33.1 g/dL (ref 30.0–36.0)
MCV: 92.9 fL (ref 80.0–100.0)
Platelets: 307 10*3/uL (ref 150–400)
RBC: 3.64 MIL/uL — ABNORMAL LOW (ref 3.87–5.11)
RDW: 13.8 % (ref 11.5–15.5)
WBC: 6.9 10*3/uL (ref 4.0–10.5)
nRBC: 0 % (ref 0.0–0.2)

## 2021-01-06 LAB — URINALYSIS, ROUTINE W REFLEX MICROSCOPIC
Bilirubin Urine: NEGATIVE
Glucose, UA: NEGATIVE mg/dL
Hgb urine dipstick: NEGATIVE
Ketones, ur: 5 mg/dL — AB
Nitrite: NEGATIVE
Protein, ur: NEGATIVE mg/dL
Specific Gravity, Urine: 1.017 (ref 1.005–1.030)
pH: 5 (ref 5.0–8.0)

## 2021-01-06 LAB — PROTIME-INR
INR: 1 (ref 0.8–1.2)
Prothrombin Time: 12.9 seconds (ref 11.4–15.2)

## 2021-01-06 LAB — APTT: aPTT: 29 seconds (ref 24–36)

## 2021-01-06 NOTE — Progress Notes (Signed)
Patient here with her for a pre-operative appointment prior to her scheduled surgery on January 12, 2021. She is scheduled for robotic assisted laparoscopic bilateral salpingo-oophorectomy, possible staging if a cancer is identified, possible laparotomy. She has her pre-admission testing appointment later today at [facility].  The surgery was discussed in detail.  See after visit summary for additional details. Visual aids used to discuss items related to surgery including sequential compression stockings, foley catheter, IV pump, multi-modal pain regimen including tylenol, photo of the surgical robot, female reproductive system to discuss surgery in detail.      Discussed post-op pain management in detail including the aspects of the enhanced recovery pathway. She left her bag with medications in the car. She is unable to report if she uses hydrocodone/APAP on her medication list along with mobic. Her son feels she does not but we would like to confirm. The son is planning on bringing her medications back to the office after her preop appointment to update her medication list. We discussed the use of tylenol post-op and to monitor for a maximum of 4,000 mg in a 24 hour period.  Also prescribed sennakot to be used after surgery and to hold if having loose stools.  Discussed bowel regimen in detail.     Discussed the use of heparin pre-op, SCDs, and measures to take at home to prevent DVT including frequent mobility.  Reportable signs and symptoms of DVT discussed. Post-operative instructions discussed and expectations for after surgery. Incisional care discussed as well including reportable signs and symptoms including erythema, drainage, wound separation.     10 minutes spent with the patient.  Verbalizing understanding of material discussed. No needs or concerns voiced at the end of the visit.   Advised patient and family to call for any needs. At the end of the appt, the patient turned to her son and told  him she would forget all of the information discussed. He acknowledged this and stated he would be able to assist her and would stay with her after surgery.  This appointment is included in the global surgical bundle as pre-operative teaching and has no charge.

## 2021-01-06 NOTE — Progress Notes (Signed)
Sodium 129, UA positive for bacteria. Results router to Dr. Berline Lopes.

## 2021-01-06 NOTE — Progress Notes (Addendum)
COVID swab appointment: n/a   COVID Vaccine Completed: yes  Date COVID Vaccine completed: Has received booster: COVID vaccine manufacturer: [employer]    Date of COVID positive in last 90 days: no   PCP - Dr. Purvis Sheffield [facility] family practice Cardiologist -    Chest x-ray - 10/28/20 results seen in encounter with Dr Mardene Celeste 11/04/20 EKG - Dr. Mardene Celeste Stress Test - long time per pt ECHO - dr Mardene Celeste Cardiac Cath - n/a Pacemaker/ICD device last checked: n/a  Spinal Cord Stimulator:n/a   Sleep Study - n/a CPAP -    Fasting Blood Sugar - n/a Checks Blood Sugar _____ times a day   Blood Thinner Instructions: Aspirin Instructions: ASA 81, Last Dose:01/11/21   Activity level:   Can go up a flight of stairs and perform activities of daily living without stopping and without symptoms of chest pain or shortness of breath. Som e SOB                                                   Anesthesia review: CAD, HTN, pulmonary emphysema, CP, UA with leukocytes and bacteria, sodium 129   Patient denies shortness of breath, fever, cough and chest pain at PAT appointment     Patient verbalized understanding of instructions that were given to them at the PAT appointment. Patient was also instructed that they will need to review over the PAT instructions again at home before surgery.

## 2021-01-06 NOTE — Patient Instructions (Signed)
Preparing for your Surgery  Plan for surgery on January 12, 2021 with Dr. Jeral Pinch at [place] will be scheduled for robotic assisted total laparoscopic bilateral salpingo-oophorectomy (removal of both ovaries and fallopian tubes), possible staging if a cancer is identified, possible laparotomy (larger incision on your abdomen if needed).   Pre-operative Testing - (DONE) You will receive a phone call from presurgical testing at [facility] to arrange for a pre-operative appointment and lab work.  -Bring your insurance card, copy of an advanced directive if applicable, medication list  -At that visit, you will be asked to sign a consent for a possible blood transfusion in case a transfusion becomes necessary during surgery.  The need for a blood transfusion is rare but having consent is a necessary part of your care.     -You can keep taking your baby aspirin with the last dose the day before surgery.  -Do not take supplements such as fish oil (omega 3), red yeast rice, turmeric before your surgery. You want to avoid medications with aspirin in them including headache powders such as BC or Goody's), Excedrin migraine.  Day Before Surgery at [place] will be asked to take in a light diet the day before surgery. You will be advised you can have clear liquids up until 3 hours before your surgery.    Eat a light diet the day before surgery.  Examples including soups, broths, toast, yogurt, mashed potatoes.  AVOID GAS PRODUCING FOODS. Things to avoid include carbonated beverages (fizzy beverages, sodas), raw fruits and raw vegetables (uncooked), or beans.   If your bowels are filled with gas, your surgeon will have difficulty visualizing your pelvic organs which increases your surgical risks.  Your role in recovery Your role is to become active as soon as directed by your doctor, while still giving yourself time to heal.  Rest when you feel tired. You will be  asked to do the following in order to speed your recovery:  - Cough and breathe deeply. This helps to clear and expand your lungs and can prevent pneumonia after surgery.  - [place]. Do mild physical activity. Walking or moving your legs help your circulation and body functions return to normal. Do not try to get up or walk alone the first time after surgery.   -If you develop swelling on one leg or the other, pain in the back of your leg, redness/warmth in one of your legs, please call the office or go to the Emergency Room to have a doppler to rule out a blood clot. For shortness of breath, chest pain-seek care in the Emergency Room as soon as possible. - Actively manage your pain. Managing your pain lets you move in comfort. We will ask you to rate your pain on a scale of zero to 10. It is your responsibility to tell your doctor or nurse where and how much you hurt so your pain can be treated.  Special Considerations -If you are diabetic, you may be placed on insulin after surgery to have closer control over your blood sugars to promote healing and recovery.  This does not mean that you will be discharged on insulin.  If applicable, your oral antidiabetics will be resumed when you are tolerating a solid diet.  -Your final pathology results from surgery should be available around one week after surgery and the results will be relayed to you when available.  -Dr. Lahoma Crocker is the  surgeon that assists your GYN Oncologist with surgery.  If you end up staying the night, the next day after your surgery you will either see Dr. Denman George, Dr. Berline Lopes, or Dr. Lahoma Crocker.  -FMLA forms can be faxed to [phone] and please allow 5-7 business days for completion.  Pain Management After Surgery -You have been prescribed your pain medication and bowel regimen medications before surgery so that you can have these available when you are discharged from the hospital. The pain  medication is for use ONLY AFTER surgery and a new prescription will not be given.   -Make sure that you have Tylenol and Ibuprofen at home to use on a regular basis after surgery for pain control. We recommend alternating the medications every hour to six hours since they work differently and are processed in the body differently for pain relief.  -Review the attached handout on narcotic use and their risks and side effects.   Bowel Regimen -You have been prescribed Sennakot-S to take nightly to prevent constipation especially if you are taking the narcotic pain medication intermittently.  It is important to prevent constipation and drink adequate amounts of liquids. You can stop taking this medication when you are not taking pain medication and you are back on your normal bowel routine.  Risks of Surgery Risks of surgery are low but include bleeding, infection, damage to surrounding structures, re-operation, blood clots, and very rarely death.   Blood Transfusion Information (For the consent to be signed before surgery)  We will be checking your blood type before surgery so in case of emergencies, we will know what type of blood you would need.                                            WHAT IS A BLOOD TRANSFUSION?  A transfusion is the replacement of blood or some of its parts. Blood is made up of multiple cells which provide different functions. Red blood cells carry oxygen and are used for blood loss replacement. White blood cells fight against infection. Platelets control bleeding. Plasma helps clot blood. Other blood products are available for specialized needs, such as hemophilia or other clotting disorders. BEFORE THE TRANSFUSION  Who gives blood for transfusions?  You may be able to donate blood to be used at a later date on yourself (autologous donation). Relatives can be asked to donate blood. This is generally not any safer than if you have received blood from a stranger. The  same precautions are taken to ensure safety when a relative's blood is donated. Healthy volunteers who are fully evaluated to make sure their blood is safe. This is blood bank blood. Transfusion therapy is the safest it has ever been in the practice of medicine. Before blood is taken from a donor, a complete history is taken to make sure that person has no history of diseases nor engages in risky social behavior (examples are intravenous drug use or sexual activity with multiple partners). The donor's travel history is screened to minimize risk of transmitting infections, such as malaria. The donated blood is tested for signs of infectious diseases, such as HIV and hepatitis. The blood is then tested to be sure it is compatible with you in order to minimize the chance of a transfusion reaction. If you or a relative donates blood, this is often done in anticipation of surgery and  is not appropriate for emergency situations. It takes many days to process the donated blood. RISKS AND COMPLICATIONS Although transfusion therapy is very safe and saves many lives, the main dangers of transfusion include:  Getting an infectious disease. Developing a transfusion reaction. This is an allergic reaction to something in the blood you were given. Every precaution is taken to prevent this. The decision to have a blood transfusion has been considered carefully by your caregiver before blood is given. Blood is not given unless the benefits outweigh the risks.  AFTER SURGERY INSTRUCTIONS  Return to work: 4-6 weeks if applicable  Activity: 1. Be up and out of the bed during the day.  Take a nap if needed.  You may walk up steps but be careful and use the hand rail.  Stair climbing will tire you more than you think, you may need to stop part way and rest.   2. No lifting or straining for 6 weeks over 10 pounds. No pushing, pulling, straining for 6 weeks.  3. No driving for 1 week(s) if you were cleared to drive  before.  Do not drive if you are taking narcotic pain medicine and make sure that your reaction time has returned.   4. You can shower as soon as the next day after surgery. Shower daily.  Use your regular soap and water (not directly on the incision) and pat your incision(s) dry afterwards; don't rub.  No tub baths or submerging your body in water until cleared by your surgeon. If you have the soap that was given to you by pre-surgical testing that was used before surgery, you do not need to use it afterwards because this can irritate your incisions.   5. No sexual activity and nothing in the vagina for 4 weeks. 8 weeks if you have a hysterectomy.  6. You may experience a small amount of clear drainage from your incisions, which is normal.  If the drainage persists, increases, or changes color please call the office.  7. Do not use creams, lotions, or ointments such as neosporin on your incisions after surgery until advised by your surgeon because they can cause removal of the dermabond glue on your incisions.    8. You may experience vaginal spotting after surgery or around the 6-8 week mark from surgery when the stitches at the top of the vagina begin to dissolve (if you have a hysterectomy).  The spotting is normal but if you experience heavy bleeding, call our office.  9. Take Tylenol or ibuprofen first for pain and only use narcotic pain medication for severe pain not relieved by the Tylenol or Ibuprofen.  Monitor your Tylenol intake to a max of 4,000 mg in a 24 hour period. You can alternate these medications after surgery.  Diet: 1. Low sodium Heart Healthy Diet is recommended but you are cleared to resume your normal (before surgery) diet after your procedure.  2. It is safe to use a laxative, such as Miralax or Colace, if you have difficulty moving your bowels. You have been prescribed Sennakot at bedtime every evening to keep bowel movements regular and to prevent constipation.    Wound  Care: 1. Keep clean and dry.  Shower daily.  Reasons to call the Doctor: Fever - Oral temperature greater than 100.4 degrees Fahrenheit Foul-smelling vaginal discharge Difficulty urinating Nausea and vomiting Increased pain at the site of the incision that is unrelieved with pain medicine. Difficulty breathing with or without chest pain New calf pain  especially if only on one side Sudden, continuing increased vaginal bleeding with or without clots.   Contacts: For questions or concerns you should contact:  Dr. Jeral Pinch at [phone]  [name], NP at [phone]  After Hours: call [phone] and have the GYN Oncologist paged/contacted (after 5 pm or on the weekends).  Messages sent via mychart are for non-urgent matters and are not responded to after hours so for urgent needs, please call the after hours number.

## 2021-01-09 ENCOUNTER — Telehealth: Payer: Self-pay

## 2021-01-09 NOTE — Telephone Encounter (Signed)
Reviewed CMP results with patient. Sodium is slightly low at 129. Patient states she has been told this before. Instructed to not limit salt intake and her labs will be rechecked the day of surgery. Reviewed urinalysis results, rare bacteria and large amount of leukocytes present. Patient denies urinary symptoms such as urgency, frequency, burning, odor or pain. Patient states "if I start to have any of those symptoms I will call you." Reviewed patients med list. Patient reports taking amlodipine, voltaren, Norco prn, mobic and aleve. Instructed patient that if she is taking voltaren that she should not take aleve, ibuprofen or mobic. Patient verbalizes understanding and states she was not informed of this. Patient reports taking Norco prn for pain but does not feel it is helpful.  Instructed patient that the nurse practitioner would be notified so that the patient will have a post-op pain management plan. Patient verbalized understanding and will call with questions or Concerns.

## 2021-01-09 NOTE — Telephone Encounter (Signed)
Following up with Carol Reyes re: medications. Per [name], NP patient needs to avoid use of NSAIDs prior to surgery. This includes Voltaren, Aleve, Ibuprofen and Mobic. Instructed patient to take tylenol or norco prn for pain. Patient verbalized understanding, instructed her to call with any questions or concerns.

## 2021-01-11 ENCOUNTER — Encounter (HOSPITAL_COMMUNITY): Payer: Self-pay | Admitting: Gynecologic Oncology

## 2021-01-11 ENCOUNTER — Telehealth: Payer: Self-pay

## 2021-01-11 NOTE — Telephone Encounter (Signed)
Telephone call to check on pre-operative status.  Patient compliant with pre-operative instructions.  Reinforced NPO after midnight.  No questions or concerns voiced.  Instructed to call for any needs.   

## 2021-01-11 NOTE — Telephone Encounter (Signed)
Spoke with Ms. Carol Reyes regarding daily alcohol consumption. Patient states she consumes 3-4 beers daily that are 12 ounces.  Per Dr. Berline Lopes patient can consume 1-2 beers tonight. Patient verbalized understanding. Instructed to call with any questions or concerns.

## 2021-01-12 ENCOUNTER — Encounter (HOSPITAL_COMMUNITY): Payer: Self-pay | Admitting: Gynecologic Oncology

## 2021-01-12 ENCOUNTER — Ambulatory Visit (HOSPITAL_COMMUNITY): Payer: Medicare HMO | Admitting: Anesthesiology

## 2021-01-12 ENCOUNTER — Ambulatory Visit (HOSPITAL_COMMUNITY): Payer: Medicare HMO | Admitting: Physician Assistant

## 2021-01-12 ENCOUNTER — Ambulatory Visit (HOSPITAL_COMMUNITY)
Admission: RE | Admit: 2021-01-12 | Discharge: 2021-01-12 | Disposition: A | Discharge status: Home / Self Care | Payer: Medicare HMO | Source: Ambulatory Visit | Attending: Gynecologic Oncology | Admitting: Gynecologic Oncology

## 2021-01-12 ENCOUNTER — Encounter (HOSPITAL_COMMUNITY)
Admission: RE | Disposition: A | Discharge status: Home / Self Care | Payer: Self-pay | Source: Ambulatory Visit | Attending: Gynecologic Oncology

## 2021-01-12 DIAGNOSIS — J449 Chronic obstructive pulmonary disease, unspecified: Secondary | ICD-10-CM | POA: Insufficient documentation

## 2021-01-12 DIAGNOSIS — K219 Gastro-esophageal reflux disease without esophagitis: Secondary | ICD-10-CM | POA: Diagnosis not present

## 2021-01-12 DIAGNOSIS — R1900 Intra-abdominal and pelvic swelling, mass and lump, unspecified site: Secondary | ICD-10-CM | POA: Diagnosis not present

## 2021-01-12 DIAGNOSIS — D4819 Other specified neoplasm of uncertain behavior of connective and other soft tissue: Secondary | ICD-10-CM

## 2021-01-12 DIAGNOSIS — D649 Anemia, unspecified: Secondary | ICD-10-CM | POA: Diagnosis not present

## 2021-01-12 DIAGNOSIS — Z87891 Personal history of nicotine dependence: Secondary | ICD-10-CM | POA: Insufficient documentation

## 2021-01-12 DIAGNOSIS — I081 Rheumatic disorders of both mitral and tricuspid valves: Secondary | ICD-10-CM | POA: Diagnosis not present

## 2021-01-12 DIAGNOSIS — R971 Elevated cancer antigen 125 [CA 125]: Secondary | ICD-10-CM | POA: Diagnosis not present

## 2021-01-12 DIAGNOSIS — N83512 Torsion of left ovary and ovarian pedicle: Secondary | ICD-10-CM | POA: Insufficient documentation

## 2021-01-12 DIAGNOSIS — R978 Other abnormal tumor markers: Secondary | ICD-10-CM | POA: Diagnosis not present

## 2021-01-12 DIAGNOSIS — Z803 Family history of malignant neoplasm of breast: Secondary | ICD-10-CM | POA: Diagnosis not present

## 2021-01-12 DIAGNOSIS — I739 Peripheral vascular disease, unspecified: Secondary | ICD-10-CM | POA: Diagnosis not present

## 2021-01-12 DIAGNOSIS — I10 Essential (primary) hypertension: Secondary | ICD-10-CM | POA: Diagnosis not present

## 2021-01-12 DIAGNOSIS — D271 Benign neoplasm of left ovary: Secondary | ICD-10-CM | POA: Insufficient documentation

## 2021-01-12 DIAGNOSIS — Z780 Asymptomatic menopausal state: Secondary | ICD-10-CM | POA: Insufficient documentation

## 2021-01-12 DIAGNOSIS — I2510 Atherosclerotic heart disease of native coronary artery without angina pectoris: Secondary | ICD-10-CM | POA: Diagnosis not present

## 2021-01-12 DIAGNOSIS — D270 Benign neoplasm of right ovary: Secondary | ICD-10-CM | POA: Insufficient documentation

## 2021-01-12 DIAGNOSIS — D759 Disease of blood and blood-forming organs, unspecified: Secondary | ICD-10-CM | POA: Diagnosis not present

## 2021-01-12 DIAGNOSIS — D481 Neoplasm of uncertain behavior of connective and other soft tissue: Secondary | ICD-10-CM

## 2021-01-12 DIAGNOSIS — R1909 Other intra-abdominal and pelvic swelling, mass and lump: Secondary | ICD-10-CM | POA: Diagnosis not present

## 2021-01-12 DIAGNOSIS — N9489 Other specified conditions associated with female genital organs and menstrual cycle: Secondary | ICD-10-CM

## 2021-01-12 DIAGNOSIS — N838 Other noninflammatory disorders of ovary, fallopian tube and broad ligament: Secondary | ICD-10-CM | POA: Diagnosis not present

## 2021-01-12 DIAGNOSIS — D4959 Neoplasm of unspecified behavior of other genitourinary organ: Secondary | ICD-10-CM | POA: Diagnosis not present

## 2021-01-12 DIAGNOSIS — N83519 Torsion of ovary and ovarian pedicle, unspecified side: Secondary | ICD-10-CM

## 2021-01-12 HISTORY — PX: ROBOTIC ASSISTED BILATERAL SALPINGO OOPHERECTOMY: SHX6078

## 2021-01-12 LAB — TYPE AND SCREEN
ABO/RH(D): A POS
Antibody Screen: NEGATIVE

## 2021-01-12 LAB — BASIC METABOLIC PANEL
Anion gap: 10 (ref 5–15)
BUN: 11 mg/dL (ref 8–23)
CO2: 25 mmol/L (ref 22–32)
Calcium: 9.4 mg/dL (ref 8.9–10.3)
Chloride: 93 mmol/L — ABNORMAL LOW (ref 98–111)
Creatinine, Ser: 0.57 mg/dL (ref 0.44–1.00)
GFR, Estimated: >60 mL/min (ref >60)
Glucose, Bld: 87 mg/dL (ref 70–99)
Potassium: 4.2 mmol/L (ref 3.5–5.1)
Sodium: 128 mmol/L — ABNORMAL LOW (ref 135–145)

## 2021-01-12 LAB — ABO/RH: ABO/RH(D): A POS

## 2021-01-12 SURGERY — SALPINGO-OOPHORECTOMY, BILATERAL, ROBOT-ASSISTED
Anesthesia: General | Laterality: Bilateral

## 2021-01-12 MED ORDER — ROCURONIUM BROMIDE 10 MG/ML (PF) SYRINGE
PREFILLED_SYRINGE | INTRAVENOUS | Status: DC | PRN
  Administered 2021-01-12: 50 mg via INTRAVENOUS
  Administered 2021-01-12 (×2): 10 mg via INTRAVENOUS

## 2021-01-12 MED ORDER — LIDOCAINE HCL (PF) 2 % IJ SOLN
INTRAMUSCULAR | Status: AC
  Filled 2021-01-12: qty 5

## 2021-01-12 MED ORDER — STERILE WATER FOR IRRIGATION IR SOLN
Status: DC | PRN
  Administered 2021-01-12: 1000 mL

## 2021-01-12 MED ORDER — CEFAZOLIN SODIUM-DEXTROSE 2-3 GM-%(50ML) IV SOLR
INTRAVENOUS | Status: DC | PRN
  Administered 2021-01-12: 2 g via INTRAVENOUS

## 2021-01-12 MED ORDER — LACTATED RINGERS IR SOLN
Status: DC | PRN
  Administered 2021-01-12: 1000 mL

## 2021-01-12 MED ORDER — SODIUM CHLORIDE (PF) 0.9 % IJ SOLN
INTRAMUSCULAR | Status: DC | PRN
  Administered 2021-01-12: 5 mL

## 2021-01-12 MED ORDER — PROPOFOL 10 MG/ML IV BOLUS
INTRAVENOUS | Status: DC | PRN
  Administered 2021-01-12: 140 mg via INTRAVENOUS

## 2021-01-12 MED ORDER — ONDANSETRON HCL 4 MG/2ML IJ SOLN: 4.0000 mg | Freq: Once | INTRAMUSCULAR | Status: DC | PRN

## 2021-01-12 MED ORDER — MIDAZOLAM HCL 2 MG/2ML IJ SOLN
INTRAMUSCULAR | Status: AC
  Filled 2021-01-12: qty 2

## 2021-01-12 MED ORDER — PREDNISONE 10 MG (21) PO TBPK: ORAL_TABLET | ORAL | 0 refills | Status: AC

## 2021-01-12 MED ORDER — HEPARIN SODIUM (PORCINE) 5000 UNIT/ML IJ SOLN
5000.0000 [IU] | INTRAMUSCULAR | Status: AC
  Administered 2021-01-12: 09:00:00 5000 [IU] via SUBCUTANEOUS
  Filled 2021-01-12: qty 1

## 2021-01-12 MED ORDER — KETAMINE HCL 10 MG/ML IJ SOLN
INTRAMUSCULAR | Status: DC | PRN
  Administered 2021-01-12 (×2): 20 mg via INTRAVENOUS
  Administered 2021-01-12: 10 mg via INTRAVENOUS

## 2021-01-12 MED ORDER — BUPIVACAINE HCL 0.25 % IJ SOLN
INTRAMUSCULAR | Status: DC | PRN
  Administered 2021-01-12: 50 mL

## 2021-01-12 MED ORDER — LACTATED RINGERS IV SOLN: INTRAVENOUS | Status: DC | PRN

## 2021-01-12 MED ORDER — SUGAMMADEX SODIUM 200 MG/2ML IV SOLN
INTRAVENOUS | Status: DC | PRN
  Administered 2021-01-12: 150 mg via INTRAVENOUS

## 2021-01-12 MED ORDER — EPHEDRINE SULFATE-NACL 50-0.9 MG/10ML-% IV SOSY
PREFILLED_SYRINGE | INTRAVENOUS | Status: DC | PRN
  Administered 2021-01-12: 10 mg via INTRAVENOUS

## 2021-01-12 MED ORDER — ONDANSETRON HCL 4 MG/2ML IJ SOLN
INTRAMUSCULAR | Status: DC | PRN
  Administered 2021-01-12: 4 mg via INTRAVENOUS

## 2021-01-12 MED ORDER — DIPHENHYDRAMINE HCL 50 MG/ML IJ SOLN
INTRAMUSCULAR | Status: AC
  Filled 2021-01-12: qty 2

## 2021-01-12 MED ORDER — PREDNISONE 10 MG (21) PO TBPK: ORAL_TABLET | ORAL | 0 refills | Status: DC

## 2021-01-12 MED ORDER — FENTANYL CITRATE (PF) 250 MCG/5ML IJ SOLN
INTRAMUSCULAR | Status: AC
  Filled 2021-01-12: qty 5

## 2021-01-12 MED ORDER — SENNOSIDES-DOCUSATE SODIUM 8.6-50 MG PO TABS: 2.0000 | ORAL_TABLET | Freq: Every day | ORAL | 0 refills | Status: AC

## 2021-01-12 MED ORDER — MIDAZOLAM HCL 5 MG/5ML IJ SOLN
INTRAMUSCULAR | Status: DC | PRN
  Administered 2021-01-12: 2 mg via INTRAVENOUS

## 2021-01-12 MED ORDER — FENTANYL CITRATE (PF) 100 MCG/2ML IJ SOLN
INTRAMUSCULAR | Status: DC | PRN
  Administered 2021-01-12: 50 ug via INTRAVENOUS
  Administered 2021-01-12: 100 ug via INTRAVENOUS
  Administered 2021-01-12 (×2): 50 ug via INTRAVENOUS

## 2021-01-12 MED ORDER — DIPHENHYDRAMINE HCL 50 MG/ML IJ SOLN
INTRAMUSCULAR | Status: DC | PRN
  Administered 2021-01-12: 12.5 mg via INTRAVENOUS

## 2021-01-12 MED ORDER — ORAL CARE MOUTH RINSE: 15.0000 mL | Freq: Once | OROMUCOSAL | Status: AC

## 2021-01-12 MED ORDER — KETAMINE HCL 10 MG/ML IJ SOLN
INTRAMUSCULAR | Status: AC
  Filled 2021-01-12: qty 1

## 2021-01-12 MED ORDER — CEFAZOLIN SODIUM-DEXTROSE 2-4 GM/100ML-% IV SOLN
INTRAVENOUS | Status: AC
  Filled 2021-01-12: qty 100

## 2021-01-12 MED ORDER — LACTATED RINGERS IV SOLN: INTRAVENOUS | Status: DC

## 2021-01-12 MED ORDER — LIDOCAINE 2% (20 MG/ML) 5 ML SYRINGE
INTRAMUSCULAR | Status: DC | PRN
  Administered 2021-01-12: 60 mg via INTRAVENOUS

## 2021-01-12 MED ORDER — FENTANYL CITRATE PF 50 MCG/ML IJ SOSY
25.0000 ug | PREFILLED_SYRINGE | INTRAMUSCULAR | Status: DC | PRN
  Administered 2021-01-12 (×2): 50 ug via INTRAVENOUS

## 2021-01-12 MED ORDER — DEXAMETHASONE SODIUM PHOSPHATE 4 MG/ML IJ SOLN: 4.0000 mg | INTRAMUSCULAR | Status: DC

## 2021-01-12 MED ORDER — CHLORHEXIDINE GLUCONATE 0.12 % MT SOLN
15.0000 mL | Freq: Once | OROMUCOSAL | Status: AC
  Administered 2021-01-12: 09:00:00 15 mL via OROMUCOSAL

## 2021-01-12 MED ORDER — ROCURONIUM BROMIDE 10 MG/ML (PF) SYRINGE
PREFILLED_SYRINGE | INTRAVENOUS | Status: AC
  Filled 2021-01-12: qty 10

## 2021-01-12 MED ORDER — PHENYLEPHRINE HCL (PRESSORS) 10 MG/ML IV SOLN
INTRAVENOUS | Status: AC
  Filled 2021-01-12: qty 2

## 2021-01-12 MED ORDER — TRAMADOL HCL 50 MG PO TABS: 50.0000 mg | ORAL_TABLET | Freq: Four times a day (QID) | ORAL | 0 refills | Status: AC | PRN

## 2021-01-12 MED ORDER — BUPIVACAINE LIPOSOME 1.3 % IJ SUSP
INTRAMUSCULAR | Status: DC | PRN
  Administered 2021-01-12: 20 mL

## 2021-01-12 MED ORDER — BUPIVACAINE LIPOSOME 1.3 % IJ SUSP
INTRAMUSCULAR | Status: AC
  Filled 2021-01-12: qty 20

## 2021-01-12 MED ORDER — FENTANYL CITRATE PF 50 MCG/ML IJ SOSY
PREFILLED_SYRINGE | INTRAMUSCULAR | Status: AC
  Filled 2021-01-12: qty 2

## 2021-01-12 MED ORDER — PHENYLEPHRINE HCL-NACL 20-0.9 MG/250ML-% IV SOLN
INTRAVENOUS | Status: DC | PRN
  Administered 2021-01-12: 20 ug/min via INTRAVENOUS

## 2021-01-12 MED ORDER — DEXAMETHASONE SODIUM PHOSPHATE 10 MG/ML IJ SOLN
INTRAMUSCULAR | Status: DC | PRN
  Administered 2021-01-12: 10 mg via INTRAVENOUS

## 2021-01-12 MED ORDER — DEXAMETHASONE SODIUM PHOSPHATE 10 MG/ML IJ SOLN
INTRAMUSCULAR | Status: AC
  Filled 2021-01-12: qty 1

## 2021-01-12 MED ORDER — BUPIVACAINE HCL 0.25 % IJ SOLN
INTRAMUSCULAR | Status: AC
  Filled 2021-01-12: qty 1

## 2021-01-12 MED ORDER — ESMOLOL HCL 100 MG/10ML IV SOLN
INTRAVENOUS | Status: AC
  Filled 2021-01-12: qty 10

## 2021-01-12 MED ORDER — ACETAMINOPHEN 500 MG PO TABS
500.0000 mg | ORAL_TABLET | ORAL | Status: AC
  Administered 2021-01-12: 09:00:00 500 mg via ORAL
  Filled 2021-01-12: qty 1

## 2021-01-12 MED ORDER — SODIUM CHLORIDE (PF) 0.9 % IJ SOLN
INTRAMUSCULAR | Status: AC
  Filled 2021-01-12: qty 10

## 2021-01-12 SURGICAL SUPPLY — 74 items
APPLICATOR ARISTA FLEXITIP XL (MISCELLANEOUS) ×3 IMPLANT
APPLICATOR SURGIFLO ENDO (HEMOSTASIS) IMPLANT
BAG COUNTER SPONGE SURGICOUNT (BAG) IMPLANT
BAG LAPAROSCOPIC 12 15 PORT 16 (BASKET) ×1 IMPLANT
BAG RETRIEVAL 12/15 (BASKET) ×2
BAG RETRIEVAL 12/15MM (BASKET) ×1
BAG SURGICOUNT SPONGE COUNTING (BAG)
BLADE SURG SZ10 CARB STEEL (BLADE) ×3 IMPLANT
COVER BACK TABLE 60X90IN (DRAPES) ×3 IMPLANT
COVER TIP SHEARS 8 DVNC (MISCELLANEOUS) ×1 IMPLANT
COVER TIP SHEARS 8MM DA VINCI (MISCELLANEOUS) ×3
DECANTER SPIKE VIAL GLASS SM (MISCELLANEOUS) ×3 IMPLANT
DERMABOND ADVANCED (GAUZE/BANDAGES/DRESSINGS) ×2
DERMABOND ADVANCED .7 DNX12 (GAUZE/BANDAGES/DRESSINGS) ×1 IMPLANT
DRAPE ARM DVNC X/XI (DISPOSABLE) ×4 IMPLANT
DRAPE COLUMN DVNC XI (DISPOSABLE) ×1 IMPLANT
DRAPE DA VINCI XI ARM (DISPOSABLE) ×12
DRAPE DA VINCI XI COLUMN (DISPOSABLE) ×3
DRAPE SHEET LG 3/4 BI-LAMINATE (DRAPES) ×3 IMPLANT
DRAPE SURG IRRIG POUCH 19X23 (DRAPES) ×3 IMPLANT
DRSG OPSITE POSTOP 4X6 (GAUZE/BANDAGES/DRESSINGS) IMPLANT
DRSG OPSITE POSTOP 4X8 (GAUZE/BANDAGES/DRESSINGS) IMPLANT
ELECT PENCIL ROCKER SW 15FT (MISCELLANEOUS) ×3 IMPLANT
ELECT REM PT RETURN 15FT ADLT (MISCELLANEOUS) ×3 IMPLANT
GAUZE 4X4 16PLY ~~LOC~~+RFID DBL (SPONGE) ×6 IMPLANT
GLOVE SURG ENC MOIS LTX SZ6 (GLOVE) ×12 IMPLANT
GLOVE SURG ENC MOIS LTX SZ6.5 (GLOVE) ×6 IMPLANT
GOWN STRL REUS W/ TWL LRG LVL3 (GOWN DISPOSABLE) ×4 IMPLANT
GOWN STRL REUS W/TWL LRG LVL3 (GOWN DISPOSABLE) ×12
HEMOSTAT ARISTA ABSORB 3G PWDR (HEMOSTASIS) ×3 IMPLANT
HOLDER FOLEY CATH W/STRAP (MISCELLANEOUS) IMPLANT
IRRIG SUCT STRYKERFLOW 2 WTIP (MISCELLANEOUS) ×3
IRRIGATION SUCT STRKRFLW 2 WTP (MISCELLANEOUS) ×1 IMPLANT
KIT PROCEDURE DA VINCI SI (MISCELLANEOUS)
KIT PROCEDURE DVNC SI (MISCELLANEOUS) IMPLANT
KIT TURNOVER KIT A (KITS) IMPLANT
MANIPULATOR ADVINCU DEL 3.0 PL (MISCELLANEOUS) IMPLANT
MANIPULATOR UTERINE 4.5 ZUMI (MISCELLANEOUS) ×3 IMPLANT
NEEDLE HYPO 21X1.5 SAFETY (NEEDLE) ×3 IMPLANT
NEEDLE SPNL 18GX3.5 QUINCKE PK (NEEDLE) IMPLANT
OBTURATOR OPTICAL STANDARD 8MM (TROCAR) ×3
OBTURATOR OPTICAL STND 8 DVNC (TROCAR) ×1
OBTURATOR OPTICALSTD 8 DVNC (TROCAR) ×1 IMPLANT
PACK ROBOT GYN CUSTOM WL (TRAY / TRAY PROCEDURE) ×3 IMPLANT
PAD POSITIONING PINK XL (MISCELLANEOUS) ×3 IMPLANT
PORT ACCESS TROCAR AIRSEAL 12 (TROCAR) ×1 IMPLANT
PORT ACCESS TROCAR AIRSEAL 5M (TROCAR) ×2
POUCH SPECIMEN RETRIEVAL 10MM (ENDOMECHANICALS) ×3 IMPLANT
SCRUB EXIDINE 4% CHG 4OZ (MISCELLANEOUS) ×3 IMPLANT
SEAL CANN UNIV 5-8 DVNC XI (MISCELLANEOUS) ×4 IMPLANT
SEAL XI 5MM-8MM UNIVERSAL (MISCELLANEOUS) ×12
SET TRI-LUMEN FLTR TB AIRSEAL (TUBING) ×3 IMPLANT
SPONGE T-LAP 18X18 ~~LOC~~+RFID (SPONGE) IMPLANT
SURGIFLO W/THROMBIN 8M KIT (HEMOSTASIS) IMPLANT
SUT MNCRL AB 4-0 PS2 18 (SUTURE) ×3 IMPLANT
SUT PDS AB 1 TP1 96 (SUTURE) ×6 IMPLANT
SUT VIC AB 0 CT1 27 (SUTURE)
SUT VIC AB 0 CT1 27XBRD ANTBC (SUTURE) IMPLANT
SUT VIC AB 2-0 CT1 27 (SUTURE) ×3
SUT VIC AB 2-0 CT1 TAPERPNT 27 (SUTURE) ×1 IMPLANT
SUT VIC AB 2-0 SH 27 (SUTURE) ×3
SUT VIC AB 2-0 SH 27X BRD (SUTURE) ×1 IMPLANT
SUT VIC AB 4-0 PS2 18 (SUTURE) ×9 IMPLANT
SYR 10ML LL (SYRINGE) IMPLANT
SYR BULB IRRIG 60ML STRL (SYRINGE) ×3 IMPLANT
SYS WOUND ALEXIS 18CM MED (MISCELLANEOUS) ×3
SYSTEM WOUND ALEXIS 18CM MED (MISCELLANEOUS) ×1 IMPLANT
TOWEL OR NON WOVEN STRL DISP B (DISPOSABLE) IMPLANT
TRAP SPECIMEN MUCUS 40CC (MISCELLANEOUS) ×3 IMPLANT
TRAY FOLEY MTR SLVR 16FR STAT (SET/KITS/TRAYS/PACK) ×3 IMPLANT
TROCAR XCEL NON-BLD 5MMX100MML (ENDOMECHANICALS) ×3 IMPLANT
UNDERPAD 30X36 HEAVY ABSORB (UNDERPADS AND DIAPERS) ×6 IMPLANT
WATER STERILE IRR 1000ML POUR (IV SOLUTION) ×3 IMPLANT
YANKAUER SUCT BULB TIP 10FT TU (MISCELLANEOUS) IMPLANT

## 2021-01-12 NOTE — Anesthesia Procedure Notes (Signed)
Procedure Name: Intubation Date/Time: 01/12/2021 10:11 AM Performed by: Lavina Hamman, CRNA Pre-anesthesia Checklist: Patient identified, Emergency Drugs available, Suction available, Patient being monitored and Timeout performed Patient Re-evaluated:Patient Re-evaluated prior to induction Oxygen Delivery Method: Circle system utilized Preoxygenation: Pre-oxygenation with 100% oxygen Induction Type: IV induction Ventilation: Mask ventilation without difficulty Laryngoscope Size: Mac and 3 Grade View: Grade I Tube type: Oral Tube size: 7.0 mm Number of attempts: 1 Airway Equipment and Method: Stylet Placement Confirmation: ETT inserted through vocal cords under direct vision, positive ETCO2, CO2 detector and breath sounds checked- equal and bilateral Secured at: 21 cm Tube secured with: Tape Dental Injury: Teeth and Oropharynx as per pre-operative assessment  Comments: ATOI

## 2021-01-12 NOTE — Op Note (Signed)
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   Anesthesia: GET  Urine Output: 400cc  Operative Findings: On EUA, minimally mobile.  Small mobile uterus.  On intra-abdominal entry, normal upper abdominal survey.  Normal omentum, small and large bowel.  Normal-appearing right adnexa.  Uterus 6 cm and normal in appearance.  Left ovary with an approximately 6 cm mass, evidence of torsion.  Mass overall smooth and adherent to the left pelvic sidewall as well as the sigmoid and rectal mesentery.  Normal-appearing left fallopian tube.  Significant fibrosis of the left retroperitoneum noted.  No intra-abdominal or pelvic evidence of disease. Frozen section consistent with a smooth muscle tumor of the ovary, mostly replaced by necrosis.  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  Estimated Blood Loss:  less than 100 mL      Total IV Fluids: see I&O flowsheet         Specimens: bilateral tubes and ovaries, pelvic washings         Complications:  None apparent; patient tolerated the procedure well.         Disposition: PACU - hemodynamically stable.  Procedure Details  The patient was seen in the Holding Room. The risks, benefits, complications, treatment options, and expected outcomes were discussed with the patient.  The patient concurred with the proposed plan, giving informed consent.  The site of surgery  properly noted/marked. The patient was identified as Carol Reyes and the procedure verified as a Robotic-assisted bilateral salpingo oophorectomy, possible staging.   After induction of anesthesia, the patient was draped and prepped in the usual sterile manner. Patient was placed in supine position after anesthesia and draped and prepped in the usual sterile manner as follows: Her arms were tucked to her side with all appropriate precautions.  The shoulders were stabilized with padded shoulder blocks applied to the acromium processes.  The patient was placed in the semi-lithotomy position in [place].  The perineum and vagina were prepped with CholoraPrep. The patient was draped after the CholoraPrep had been allowed to dry for 3 minutes.  A Time Out was held and the above information confirmed.  The urethra was prepped with Betadine. Foley catheter was placed.  A sterile speculum was placed in the vagina.  The cervix was grasped with a single-tooth tenaculum. The cervix was dilated with Kennon Rounds dilators.  The ZUMI uterine manipulator with a medium colpotomizer ring was placed without difficulty.  A pneum occluder balloon was placed over the manipulator.  OG tube placement was confirmed and to suction.   Next, a 10 mm skin incision was made 1 cm below the subcostal margin in the midclavicular line.  The 5 mm Optiview port and scope was used for direct entry.  Opening pressure was under 10 mm CO2.  The abdomen was insufflated and the findings were noted as above.   At this point and all points during the procedure, the patient's intra-abdominal pressure did not exceed 15 mmHg. Next, an 8 mm skin incision was made superior to the umbilicus and a right  and left port were placed about 8 cm lateral to the robot port on the right and left side.  A fourth arm was placed on the right.  The 5 mm assist trocar was exchanged for a 10-12 mm port. All ports were placed under direct visualization.  The patient was  placed in steep Trendelenburg.  Bowel was folded away into the upper abdomen.  The robot was docked in the normal manner.  The left peritoneum was opened parallel to the IP ligament to open the retroperitoneal space.  The left round ligament was sacrificed to help in this dissection.  The ureter was noted along the medial leaf of the broad ligament.  Attention was then turned medially to the mass.  Very slow and meticulous blunt dissection was used to free the ovarian mass from the sigmoid and rectum mesentery inferiorly and from the pelvic sidewall laterally.  Once freed from the sidewall, the infundibulopelvic ligament was identified.  The peritoneum above the ureter was incised, and the IP ligament was cauterized and cut.  Given fibrosis of the retroperitoneum, ureterolysis was performed, freeing the left ureter to below the level of the mass.  Ultimately, the ovarian mass was able to be bluntly freed from the sidewall.  Once it had been freed 360 degrees, the utero-ovarian ligament was isolated, cauterized, and transected, freeing the ovary.  The ovary was placed in an Endo Catch bag for removal.  The left fallopian tube was identified and cauterized and transected just lateral to the cornua.  The right peritoneum was opened parallel to the IP ligament to open the retroperitoneal space. The round ligament was left intact. The ureter was noted to be on the medial leaf of the broad ligament.  The peritoneum above the ureter was incised and stretched and the infundibulopelvic ligament was skeletonized, cauterized and cut.  The right utero-ovarian ligament as well as the right fallopian tube just lateral to the fundus were both isolated, cauterized, and transected, freeing the right ovary and fallopian tube.  These were placed in an Endo Catch bag.  All robotic instruments were removed from the abdomen and the robot was undocked.  The patient was then placed in supine position.  With the abdomen still  insufflated, the supraumbilical trocar was removed and the incision stented to a length of 6 cm with a scalpel.  The incision was then taken down to the peritoneum with electrocautery and the peritoneum opened under direct visualization.  The Endo Catch bag containing the enlarged left torsed ovary was then removed to be handed off the field and sent to pathology for frozen section.  The left fallopian tube as well as the right tube and ovary were also removed through the incision.  An Alexis retractor in the laparoscopic cap system were then set up and the abdomen was reinsufflated.  Robot was redocked after the patient was placed in Trendelenburg.  The robot was redocked and instruments replaced under direct visualization.  Operative sites were examined and some oozing was noted from the raw peritoneal surfaces.  Pressure was held for several minutes.  An area of the rectal mesentery that was bleeding was reinforced with a interrupted suture using 2-0 Vicryl.  The left round ligament was also reapproximated to itself laterally using a figure-of-eight of 2-0 Vicryl.  Once pathology returned with benign findings, pelvis was irrigated and hemostasis assured.  Arista was placed on the raw peritoneal surfaces.  At this point in the procedure was completed.  Robotic instruments were  removed under direct visulaization.  The robot was undocked.   The mini laparotomy fascia was closed with 2 looped #1 PDS tied in the midline.  The subcutaneous tissue was irrigated and Exparel was injected for local anesthesia.  2-0 Vicryl was used to close the subcutaneous tissue.  The fascia at the 10-12 mm port was closed with 0 Vicryl on a UR-5 needle.  The subcuticular tissue of all of the incisions was closed with 4-0 Vicryl and the skin was closed with 4-0 Monocryl in a subcuticular manner.  Dermabond was applied.    The vagina was swabbed after the uterine manipulator was removed with minimal bleeding noted.  Foley  catheter was removed.  All sponge, lap and needle counts were correct x  3.   The patient was transferred to the recovery room in stable condition.  Jeral Pinch, MD

## 2021-01-12 NOTE — Progress Notes (Signed)
After prepping the vulva and vagina with ChloraPrep, erythematous appearing hives were noted on the patient's abdomen.  This worsened over time and after the surgery, diffuse erythema was noted over the area of the ChloraPrep on her abdomen as well as her upper legs and vulva.  Patient received Benadryl as well as steroid prior to surgery.  We will plan to send her home with an oral steroid taper as well as a topical steroid cream.  This was discussed with her son.  Jeral Pinch MD Gynecologic Oncology

## 2021-01-12 NOTE — Brief Op Note (Signed)
01/12/2021  12:41 PM  PATIENT:  Carol Reyes  80 y.o. female  PRE-OPERATIVE DIAGNOSIS:  PELVIC MASS,ELEVATED TUMOR MARKERS  POST-OPERATIVE DIAGNOSIS:  PELVIC MASS,ELEVATED TUMOR MARKERS  PROCEDURE:  Procedure(s): XI ROBOTIC ASSISTED BILATERAL SALPINGO OOPHORECTOMY, MINI LAPAROTOMY, (Bilateral)  SURGEON:  Surgeon(s) and Role:    * Lafonda Mosses, MD - Primary    Lahoma Crocker, MD  EBL:  75 mL   BLOOD ADMINISTERED:none  DRAINS: none   LOCAL MEDICATIONS USED:  MARCAINE     SPECIMEN: bilateral tubes and ovaries, pelvic washings  DISPOSITION OF SPECIMEN:  PATHOLOGY  COUNTS:  YES  TOURNIQUET:  * No tourniquets in log *  DICTATION: .Note written in EPIC  PLAN OF CARE: Discharge to home after PACU  PATIENT DISPOSITION:  PACU - hemodynamically stable.   Delay start of Pharmacological VTE agent (>24hrs) due to surgical blood loss or risk of bleeding: not applicable

## 2021-01-12 NOTE — Transfer of Care (Signed)
Immediate Anesthesia Transfer of Care Note  Patient: Carol Reyes  Procedure(s) Performed: Procedure(s): XI ROBOTIC ASSISTED BILATERAL SALPINGO OOPHORECTOMY, MINI LAPAROTOMY, (Bilateral)  Patient Location: PACU  Anesthesia Type:General  Level of Consciousness:  sedated, patient cooperative and responds to stimulation  Airway & Oxygen Therapy:Patient Spontanous Breathing and Patient connected to face mask oxgen  Post-op Assessment:  Report given to PACU RN and Post -op Vital signs reviewed and stable  Post vital signs:  Reviewed and stable  Last Vitals:  Vitals:   01/12/21 0854  BP: (!) 164/79  Pulse: 79  Resp: 16  Temp: 36.7 C  SpO2: 09%    Complications: No apparent anesthesia complications

## 2021-01-12 NOTE — Anesthesia Preprocedure Evaluation (Addendum)
Anesthesia Evaluation  Patient identified by MRN, date of birth, ID band Patient awake    Reviewed: Allergy & Precautions, NPO status , Patient's Chart, lab work & pertinent test results  Airway Mallampati: II  TM Distance: <3 FB Neck ROM: Full    Dental  (+) Dental Advisory Given, Edentulous Upper   Pulmonary COPD, former smoker,    Pulmonary exam normal breath sounds clear to auscultation       Cardiovascular hypertension, Pt. on medications (-) angina+ CAD and + Peripheral Vascular Disease  (-) Past MI Normal cardiovascular exam Rhythm:Regular Rate:Normal     Neuro/Psych negative neurological ROS  negative psych ROS   GI/Hepatic GERD  Medicated,(+)     substance abuse  alcohol use,   Endo/Other  negative endocrine ROS  Renal/GU negative Renal ROS     Musculoskeletal negative musculoskeletal ROS (+)   Abdominal   Peds  Hematology  (+) Blood dyscrasia, anemia ,   Anesthesia Other Findings Day of surgery medications reviewed with the patient.  Reproductive/Obstetrics PELVIC MASS,ELEVATED TUMOR MARKERS                            Anesthesia Physical Anesthesia Plan  ASA: 3  Anesthesia Plan: General   Post-op Pain Management:    Induction: Intravenous  PONV Risk Score and Plan: 4 or greater and Dexamethasone, Ondansetron and Treatment may vary due to age or medical condition  Airway Management Planned: Oral ETT  Additional Equipment:   Intra-op Plan:   Post-operative Plan: Extubation in OR  Informed Consent: I have reviewed the patients History and Physical, chart, labs and discussed the procedure including the risks, benefits and alternatives for the proposed anesthesia with the patient or authorized representative who has indicated his/her understanding and acceptance.     Dental advisory given  Plan Discussed with: CRNA  Anesthesia Plan Comments: (2nd PIV after  induction )       Anesthesia Quick Evaluation

## 2021-01-12 NOTE — OR Nursing (Signed)
After anesthesia induction before skin prep, hives were noted on abdomen. Dr. Berline Lopes notified.

## 2021-01-12 NOTE — Interval H&P Note (Signed)
History and Physical Interval Note:  01/12/2021 9:33 AM  Carol Reyes  has presented today for surgery, with the diagnosis of [place].  The various methods of treatment have been discussed with the patient and family. After consideration of risks, benefits and other options for treatment, the patient has consented to  Procedure(s): XI ROBOTIC ASSISTED BILATERAL SALPINGO OOPHORECTOMY, POSSIBLE LAPAROTOMY, POSSIBLE STAGING (Bilateral) as a surgical intervention.  The patient's history has been reviewed, patient examined, no change in status, stable for surgery.  I have reviewed the patient's chart and labs.  Questions were answered to the patient's satisfaction.     Lafonda Mosses

## 2021-01-12 NOTE — Anesthesia Postprocedure Evaluation (Signed)
Anesthesia Post Note  Patient: JAMEEKA MARCY  Procedure(s) Performed: XI ROBOTIC ASSISTED BILATERAL SALPINGO OOPHORECTOMY, MINI LAPAROTOMY, (Bilateral)     Patient location during evaluation: PACU Anesthesia Type: General Level of consciousness: awake and alert Pain management: pain level controlled Vital Signs Assessment: post-procedure vital signs reviewed and stable Respiratory status: spontaneous breathing, nonlabored ventilation and respiratory function stable Cardiovascular status: blood pressure returned to baseline and stable Postop Assessment: no apparent nausea or vomiting Anesthetic complications: no   No notable events documented.  Last Vitals:  Vitals:   01/12/21 1430 01/12/21 1452  BP: (!) 155/76 117/63  Pulse: 98 98  Resp: 14   Temp:    SpO2: 98% 98%    Last Pain:  Vitals:   01/12/21 1452  TempSrc:   PainSc: 0-No pain                 Catalina Gravel

## 2021-01-12 NOTE — Discharge Instructions (Addendum)
01/12/2021  You had a reaction to one of the solutions that we used to clean your skin. We have sent a prescription for a steroid taper (pills) and a steroid cream. You can also use benadryl if you are having a lot of itching.  Activity: 1. Be up and out of the bed during the day.  Take a nap if needed.  You may walk up steps but be careful and use the hand rail.  Stair climbing will tire you more than you think, you may need to stop part way and rest.   2. No lifting or straining for 6 weeks (no more than 10 weeks).  3. Do Not drive if you are taking narcotic pain medicine and until you feel that you can brake safely. For most people, this is 5-10 days.  4. Shower daily.  Use soap and water on your incision and pat dry; don't rub.   5. No sexual activity and nothing in the vagina for 4 weeks.  Medications:  - Take ibuprofen (don't take this is you are using mobic) and tylenol first line for pain control. Take these regularly (every 6 hours) to decrease the build up of pain.  - If necessary, for severe pain not relieved by ibuprofen, take tramadol.  - While taking tramadol you should take sennakot every night to reduce the likelihood of constipation. If this causes diarrhea, stop its use.  Diet: 1. Low sodium Heart Healthy Diet is recommended.  2. It is safe to use a laxative if you have difficulty moving your bowels.   Wound Care: 1. Keep clean and dry.  Shower daily.  Reasons to call the Doctor:  Fever - Oral temperature greater than 100.4 degrees Fahrenheit Foul-smelling vaginal discharge Difficulty urinating Nausea and vomiting Increased pain at the site of the incision that is unrelieved with pain medicine. Difficulty breathing with or without chest pain New calf pain especially if only on one side Sudden, continuing increased vaginal bleeding with or without clots.   Follow-up: 1. See Jeral Pinch in 3 weeks as previously scheduled.  Contacts: For questions or  concerns you should contact:  Dr. Jeral Pinch at [phone] After hours and on week-ends call [phone] and ask to speak to the physician on call for Gynecologic Oncology

## 2021-01-13 ENCOUNTER — Encounter (HOSPITAL_COMMUNITY): Payer: Self-pay | Admitting: Gynecologic Oncology

## 2021-01-13 ENCOUNTER — Telehealth: Payer: Self-pay

## 2021-01-13 LAB — CYTOLOGY - NON PAP

## 2021-01-13 NOTE — Telephone Encounter (Signed)
Spoke with Carol Reyes this morning. She states she is eating, drinking and urinating well. She has not had a BM yet and is not passing gas. Encouraged her to drink plenty of water and take senokot as prescribed. She denies fever or chills. Incisions are dry and intact. She denies rash, redness, itching or swelling to abdomen. She reports vaginal spotting starting last night but has gotten lighter. She states her son gave her a pain pill this morning and rates her pain 0/10. Patient unsure what pain medicine he gave her.  Instructed to call office with any fever, chills, purulent drainage, increasing vaginal discharge uncontrolled pain or any other questions or concerns. Patient verbalizes understanding.   Pt aware of post op appointments as well as the office number [phone] and after hours number [phone] to call if she has any questions or concerns

## 2021-01-13 NOTE — Telephone Encounter (Signed)
Left message for son requesting return call to review medications.

## 2021-01-16 ENCOUNTER — Telehealth: Payer: Self-pay

## 2021-01-16 NOTE — Telephone Encounter (Signed)
Received call from Ms. Larose. Patient reports having to strain to have a bowel movement yesterday. She has had some vaginal discharge since then. She reports having pink discharge when she wipes and having 2 small pink/red spots on her pad today.  She denies any fever, chills or pain. She states "I'm not that bad. Doing ok". Advised patient to incorporate miralax in addition to the senokot and to drink 64 ounces of water daily.  Instructed to monitor vaginal discharge and notify our office if it increases, develops an odor,  if she has pain, fever or chills or any other questions or concerns. Patient verbalized understanding. She has both the office number and after hours number should she need it.

## 2021-01-17 LAB — SURGICAL PATHOLOGY

## 2021-01-18 ENCOUNTER — Telehealth: Payer: Self-pay

## 2021-01-18 NOTE — Telephone Encounter (Signed)
Returning call to patient. Unable to leave to voicemail. Will attempt to reach out again.

## 2021-01-18 NOTE — Telephone Encounter (Signed)
Spoke with Ms. Rheaume, patient inquiring when she will be able to have sex again. Per discharge instructions no sexual activity and nothing in the vagina for 4 weeks. Patient verbalized understanding.  Followed up on vaginal spotting in earlier in the week. Patient reports her vaginal spotting has stopped and her bowel movements are more regular now.  Reviewed surgical pathology with patient. Per [name], NP No pre-cancer or cancer seen on final pathology. Patient happy with news.  Provided patient with after hours number should be need anything over the holiday.

## 2021-02-02 ENCOUNTER — Encounter: Payer: Self-pay | Admitting: Gynecologic Oncology

## 2021-02-02 NOTE — Progress Notes (Signed)
Gynecologic Oncology Return Clinic Visit  02/03/21  Reason for Visit: post-op follow-up  Treatment History: Complex adnexal mass and elevated CA-125 11/17: Robotic-assisted laparoscopic bilateral salpingoophorectomy, mini-lap for specimen removal, enterolysis, left ureterolysis  Interval History: Presents for follow-up after surgery prior to reports doing well.  Denies any abdominal or pelvic pain.  Denies any issues with her incision.  Reports regular bowel and bladder function.  Endorses good appetite, no nausea or emesis.  Past Medical/Surgical History: Past Medical History:  Diagnosis Date   Age-related cognitive decline    Alcohol use disorder    Allergy    Basal cell carcinoma (BCC) of face    Left side   CAD (coronary artery disease), native coronary artery    without angina pectoris   Chronic fatigue    Chronic hyponatremia    Diverticulosis of colon    Hyperlipemia    Hypertension    Melanoma of foot, unspecified laterality (HCC)    PAD (peripheral artery disease) (HCC)    Pneumonia    Pulmonary emphysema, unspecified emphysema type ([place])    Pulmonary nodule, right     Past Surgical History:  Procedure Laterality Date   APPENDECTOMY     BASAL CELL CARCINOMA EXCISION Left    face   MELANOMA EXCISION Left    foot   ROBOTIC ASSISTED BILATERAL SALPINGO OOPHERECTOMY Bilateral 01/12/2021   Procedure: XI ROBOTIC ASSISTED BILATERAL SALPINGO OOPHORECTOMY, MINI LAPAROTOMY,;  Surgeon: Lafonda Mosses, MD;  Location: [facility];  Service: Gynecology;  Laterality: Bilateral;    Family History  Problem Relation Age of Onset   Breast cancer Mother    Colon cancer Neg Hx    Ovarian cancer Neg Hx    Endometrial cancer Neg Hx    Pancreatic cancer Neg Hx    Prostate cancer Neg Hx     Social History   Socioeconomic History   Marital status: Widowed    Spouse name: Not on file   Number of children: Not on file   Years of education: Not on file   Highest education  level: Not on file  Occupational History   Not on file  Tobacco Use   Smoking status: Former    Packs/day: 1.00    Years: 21.00    Pack years: 21.00    Types: Cigarettes    Quit date: 02/26/2014    Years since quitting: 6.9   Smokeless tobacco: Never  Vaping Use   Vaping Use: Former  Substance and Sexual Activity   Alcohol use: Yes    Alcohol/week: 4.0 standard drinks    Types: 4 Cans of beer per week    Comment: 2 a night   Drug use: Never   Sexual activity: Not Currently    Birth control/protection: Post-menopausal  Other Topics Concern   Not on file  Social History Narrative   Not on file   Social Determinants of Health   Financial Resource Strain: Not on file  Food Insecurity: Not on file  Transportation Needs: Not on file  Physical Activity: Not on file  Stress: Not on file  Social Connections: Not on file    Current Medications:  Current Outpatient Medications:    aspirin EC 81 MG tablet, Take 81 mg by mouth daily. Swallow whole., Disp: , Rfl:    cilostazol (PLETAL) 50 MG tablet, SMARTSIG:1 Tablet(s) By Mouth Morning-Evening, Disp: , Rfl:    rosuvastatin (CRESTOR) 5 MG tablet, Take 5 mg by mouth daily., Disp: , Rfl:    valsartan (  DIOVAN) 160 MG tablet, Take 160 mg by mouth daily., Disp: , Rfl:    amLODipine (NORVASC) 2.5 MG tablet, Take 2.5 mg by mouth daily. (Patient not taking: Reported on 02/02/2021), Disp: , Rfl:    Camphor-Menthol-Methyl Sal (SALONPAS) 3.03-03-08 % PTCH, Apply 1 application topically daily as needed (pain). (Patient not taking: Reported on 02/02/2021), Disp: , Rfl:    diclofenac (VOLTAREN) 75 MG EC tablet, Take 75 mg by mouth 2 (two) times daily. (Patient not taking: Reported on 02/02/2021), Disp: , Rfl:    HYDROcodone-acetaminophen (NORCO) 7.5-325 MG tablet, Take 1 tablet by mouth daily as needed for moderate pain or severe pain. (Patient not taking: Reported on 02/02/2021), Disp: , Rfl:    meloxicam (MOBIC) 7.5 MG tablet, Take 7.5 mg by mouth  daily. (Patient not taking: Reported on 02/02/2021), Disp: , Rfl:    omeprazole (PRILOSEC OTC) 20 MG tablet, Take 20 mg by mouth daily as needed for heartburn. (Patient not taking: Reported on 02/02/2021), Disp: , Rfl:    predniSONE (STERAPRED UNI-PAK 21 TAB) 10 MG (21) TBPK tablet, 3 tabs daily x 7d--1 tab daily x 7d (Patient not taking: Reported on 02/02/2021), Disp: 42 tablet, Rfl: 0  Review of Systems: Denies appetite changes, fevers, chills, fatigue, unexplained weight changes. Denies hearing loss, neck lumps or masses, mouth sores, ringing in ears or voice changes. Denies cough or wheezing.  Denies shortness of breath. Denies chest pain or palpitations. Denies leg swelling. Denies abdominal distention, pain, blood in stools, constipation, diarrhea, nausea, vomiting, or early satiety. Denies pain with intercourse, dysuria, frequency, hematuria or incontinence. Denies hot flashes, pelvic pain, vaginal bleeding or vaginal discharge.   Denies joint pain, back pain or muscle pain/cramps. Denies itching, rash, or wounds. Denies dizziness, headaches, numbness or seizures. Denies swollen lymph nodes or glands, denies easy bruising or bleeding. Denies anxiety, depression, confusion, or decreased concentration.  Physical Exam: BP (!) 176/71 (BP Location: Left Arm, Patient Position: Sitting)   Pulse (!) 102   Temp 97.9 F (36.6 C)   Resp 16   Ht 5\' 4"  (1.626 m)   Wt 130 lb 9.6 oz (59.2 kg)   SpO2 100%   BMI 22.42 kg/m  General: Alert, oriented, no acute distress. HEENT: Normocephalic, atraumatic, sclera anicteric. Chest: Unlabored breathing on room air. Abdomen: soft, nontender.  Normoactive bowel sounds.  No masses or hepatosplenomegaly appreciated.  Well-healing incisions.  Laboratory & Radiologic Studies: Pathology 11/17: A. ADNEXA, LEFT, OOPHORECTOMY:  -  Spindle cell neoplasm, infarcted, most consistent with ovarian  fibroma  -  See comment   B. OVARY, RIGHT, AND BILATERAL  FALLOPIAN TUBES, SALPINGO OOPHORECTOMY:  -  Benign ovary  -  Benign fallopian tubes with paratubal cysts   Assessment & Plan: Carol Reyes is a 80 y.o. 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  Patient is doing very well from a postoperative standpoint and meeting milestones.  We discussed continued activity restrictions as well as postoperative expectations.  She will be discharged back to her PCP and gynecologist.  Patient given a copy of her pathology report, which we reviewed together today with her son.  She knows that she can call our office with any needs in the future.  28 minutes of total time was spent for this patient encounter, including preparation, face-to-face counseling with the patient and coordination of care, and documentation of the encounter.  Jeral Pinch, MD  Division of Gynecologic Oncology  Department  of Obstetrics and Gynecology  [facility]

## 2021-02-03 ENCOUNTER — Encounter: Payer: Medicare HMO | Admitting: Gynecologic Oncology

## 2021-02-03 ENCOUNTER — Inpatient Hospital Stay: Payer: Medicare HMO | Attending: Gynecologic Oncology | Admitting: Gynecologic Oncology

## 2021-02-03 ENCOUNTER — Other Ambulatory Visit: Payer: Self-pay

## 2021-02-03 ENCOUNTER — Encounter: Payer: Self-pay | Admitting: Gynecologic Oncology

## 2021-02-03 VITALS — BP 176/71 | HR 102 | Temp 97.9°F | Resp 16 | Ht 64.0 in | Wt 130.6 lb

## 2021-02-03 DIAGNOSIS — D271 Benign neoplasm of left ovary: Secondary | ICD-10-CM

## 2021-02-03 DIAGNOSIS — Z90722 Acquired absence of ovaries, bilateral: Secondary | ICD-10-CM

## 2021-02-03 DIAGNOSIS — N9489 Other specified conditions associated with female genital organs and menstrual cycle: Secondary | ICD-10-CM

## 2021-02-03 NOTE — Patient Instructions (Signed)
It was good to see you today.  You are healing well from surgery.  Remember, lifting, pushing, and pulling restrictions are in place until 6 weeks after surgery.

## 2021-02-06 ENCOUNTER — Encounter: Payer: Medicare HMO | Admitting: Gynecologic Oncology

## 2021-07-27 DIAGNOSIS — I2510 Atherosclerotic heart disease of native coronary artery without angina pectoris: Secondary | ICD-10-CM | POA: Diagnosis not present

## 2021-07-27 DIAGNOSIS — I700 Atherosclerosis of aorta: Secondary | ICD-10-CM | POA: Diagnosis not present

## 2021-07-27 DIAGNOSIS — S2241XA Multiple fractures of ribs, right side, initial encounter for closed fracture: Secondary | ICD-10-CM | POA: Diagnosis not present

## 2021-07-27 DIAGNOSIS — R079 Chest pain, unspecified: Secondary | ICD-10-CM | POA: Diagnosis not present

## 2021-07-31 DIAGNOSIS — H353211 Exudative age-related macular degeneration, right eye, with active choroidal neovascularization: Secondary | ICD-10-CM | POA: Diagnosis not present

## 2021-07-31 DIAGNOSIS — H353221 Exudative age-related macular degeneration, left eye, with active choroidal neovascularization: Secondary | ICD-10-CM | POA: Diagnosis not present

## 2021-08-02 DIAGNOSIS — R69 Illness, unspecified: Secondary | ICD-10-CM | POA: Diagnosis not present

## 2021-08-02 DIAGNOSIS — I2510 Atherosclerotic heart disease of native coronary artery without angina pectoris: Secondary | ICD-10-CM | POA: Diagnosis not present

## 2021-08-02 DIAGNOSIS — E871 Hypo-osmolality and hyponatremia: Secondary | ICD-10-CM | POA: Diagnosis not present

## 2021-08-02 DIAGNOSIS — F1010 Alcohol abuse, uncomplicated: Secondary | ICD-10-CM | POA: Diagnosis not present

## 2021-08-02 DIAGNOSIS — E7800 Pure hypercholesterolemia, unspecified: Secondary | ICD-10-CM | POA: Diagnosis not present

## 2021-08-02 DIAGNOSIS — Z79899 Other long term (current) drug therapy: Secondary | ICD-10-CM | POA: Diagnosis not present

## 2021-08-02 DIAGNOSIS — Z1329 Encounter for screening for other suspected endocrine disorder: Secondary | ICD-10-CM | POA: Diagnosis not present

## 2021-08-02 DIAGNOSIS — I739 Peripheral vascular disease, unspecified: Secondary | ICD-10-CM | POA: Diagnosis not present

## 2021-08-08 DIAGNOSIS — I739 Peripheral vascular disease, unspecified: Secondary | ICD-10-CM | POA: Diagnosis not present

## 2021-08-16 DIAGNOSIS — K5904 Chronic idiopathic constipation: Secondary | ICD-10-CM | POA: Diagnosis not present

## 2021-08-16 DIAGNOSIS — R413 Other amnesia: Secondary | ICD-10-CM | POA: Diagnosis not present

## 2021-08-16 DIAGNOSIS — N289 Disorder of kidney and ureter, unspecified: Secondary | ICD-10-CM | POA: Diagnosis not present

## 2021-08-16 DIAGNOSIS — I2510 Atherosclerotic heart disease of native coronary artery without angina pectoris: Secondary | ICD-10-CM | POA: Diagnosis not present

## 2021-08-16 DIAGNOSIS — I10 Essential (primary) hypertension: Secondary | ICD-10-CM | POA: Diagnosis not present

## 2021-08-16 DIAGNOSIS — I739 Peripheral vascular disease, unspecified: Secondary | ICD-10-CM | POA: Diagnosis not present

## 2021-08-21 DIAGNOSIS — Z08 Encounter for follow-up examination after completed treatment for malignant neoplasm: Secondary | ICD-10-CM | POA: Diagnosis not present

## 2021-08-21 DIAGNOSIS — Z85828 Personal history of other malignant neoplasm of skin: Secondary | ICD-10-CM | POA: Diagnosis not present

## 2021-08-21 DIAGNOSIS — W541XXA Struck by dog, initial encounter: Secondary | ICD-10-CM | POA: Diagnosis not present

## 2021-08-21 DIAGNOSIS — L570 Actinic keratosis: Secondary | ICD-10-CM | POA: Diagnosis not present

## 2021-08-21 DIAGNOSIS — L821 Other seborrheic keratosis: Secondary | ICD-10-CM | POA: Diagnosis not present

## 2021-08-21 DIAGNOSIS — X32XXXD Exposure to sunlight, subsequent encounter: Secondary | ICD-10-CM | POA: Diagnosis not present

## 2021-08-21 DIAGNOSIS — Z1283 Encounter for screening for malignant neoplasm of skin: Secondary | ICD-10-CM | POA: Diagnosis not present

## 2021-09-04 DIAGNOSIS — H43813 Vitreous degeneration, bilateral: Secondary | ICD-10-CM | POA: Diagnosis not present

## 2021-09-04 DIAGNOSIS — H353211 Exudative age-related macular degeneration, right eye, with active choroidal neovascularization: Secondary | ICD-10-CM | POA: Diagnosis not present

## 2021-09-04 DIAGNOSIS — H353221 Exudative age-related macular degeneration, left eye, with active choroidal neovascularization: Secondary | ICD-10-CM | POA: Diagnosis not present

## 2021-09-13 DIAGNOSIS — Z08 Encounter for follow-up examination after completed treatment for malignant neoplasm: Secondary | ICD-10-CM | POA: Diagnosis not present

## 2021-09-13 DIAGNOSIS — X32XXXD Exposure to sunlight, subsequent encounter: Secondary | ICD-10-CM | POA: Diagnosis not present

## 2021-09-13 DIAGNOSIS — L820 Inflamed seborrheic keratosis: Secondary | ICD-10-CM | POA: Diagnosis not present

## 2021-09-13 DIAGNOSIS — Z85820 Personal history of malignant melanoma of skin: Secondary | ICD-10-CM | POA: Diagnosis not present

## 2021-09-13 DIAGNOSIS — L570 Actinic keratosis: Secondary | ICD-10-CM | POA: Diagnosis not present

## 2021-10-09 DIAGNOSIS — H353221 Exudative age-related macular degeneration, left eye, with active choroidal neovascularization: Secondary | ICD-10-CM | POA: Diagnosis not present

## 2021-10-09 DIAGNOSIS — H353211 Exudative age-related macular degeneration, right eye, with active choroidal neovascularization: Secondary | ICD-10-CM | POA: Diagnosis not present

## 2021-11-06 DIAGNOSIS — X32XXXD Exposure to sunlight, subsequent encounter: Secondary | ICD-10-CM | POA: Diagnosis not present

## 2021-11-06 DIAGNOSIS — S8992XA Unspecified injury of left lower leg, initial encounter: Secondary | ICD-10-CM | POA: Diagnosis not present

## 2021-11-06 DIAGNOSIS — L570 Actinic keratosis: Secondary | ICD-10-CM | POA: Diagnosis not present

## 2021-11-20 DIAGNOSIS — H43813 Vitreous degeneration, bilateral: Secondary | ICD-10-CM | POA: Diagnosis not present

## 2021-11-20 DIAGNOSIS — H353211 Exudative age-related macular degeneration, right eye, with active choroidal neovascularization: Secondary | ICD-10-CM | POA: Diagnosis not present

## 2021-11-20 DIAGNOSIS — H353221 Exudative age-related macular degeneration, left eye, with active choroidal neovascularization: Secondary | ICD-10-CM | POA: Diagnosis not present

## 2021-12-26 DIAGNOSIS — C44729 Squamous cell carcinoma of skin of left lower limb, including hip: Secondary | ICD-10-CM | POA: Diagnosis not present

## 2021-12-26 DIAGNOSIS — L821 Other seborrheic keratosis: Secondary | ICD-10-CM | POA: Diagnosis not present

## 2022-01-01 DIAGNOSIS — H353211 Exudative age-related macular degeneration, right eye, with active choroidal neovascularization: Secondary | ICD-10-CM | POA: Diagnosis not present

## 2022-01-01 DIAGNOSIS — H43823 Vitreomacular adhesion, bilateral: Secondary | ICD-10-CM | POA: Diagnosis not present

## 2022-01-01 DIAGNOSIS — H353221 Exudative age-related macular degeneration, left eye, with active choroidal neovascularization: Secondary | ICD-10-CM | POA: Diagnosis not present

## 2022-01-23 DIAGNOSIS — Z08 Encounter for follow-up examination after completed treatment for malignant neoplasm: Secondary | ICD-10-CM | POA: Diagnosis not present

## 2022-01-23 DIAGNOSIS — Z85828 Personal history of other malignant neoplasm of skin: Secondary | ICD-10-CM | POA: Diagnosis not present

## 2022-02-07 DIAGNOSIS — R10811 Right upper quadrant abdominal tenderness: Secondary | ICD-10-CM | POA: Diagnosis not present

## 2022-02-07 DIAGNOSIS — R413 Other amnesia: Secondary | ICD-10-CM | POA: Diagnosis not present

## 2022-02-07 DIAGNOSIS — I2510 Atherosclerotic heart disease of native coronary artery without angina pectoris: Secondary | ICD-10-CM | POA: Diagnosis not present

## 2022-02-07 DIAGNOSIS — K5730 Diverticulosis of large intestine without perforation or abscess without bleeding: Secondary | ICD-10-CM | POA: Diagnosis not present

## 2022-02-07 DIAGNOSIS — R4181 Age-related cognitive decline: Secondary | ICD-10-CM | POA: Diagnosis not present

## 2022-02-07 DIAGNOSIS — K5900 Constipation, unspecified: Secondary | ICD-10-CM | POA: Diagnosis not present

## 2022-02-07 DIAGNOSIS — J3089 Other allergic rhinitis: Secondary | ICD-10-CM | POA: Diagnosis not present

## 2022-02-07 DIAGNOSIS — R69 Illness, unspecified: Secondary | ICD-10-CM | POA: Diagnosis not present

## 2022-03-01 DIAGNOSIS — H43813 Vitreous degeneration, bilateral: Secondary | ICD-10-CM | POA: Diagnosis not present

## 2022-03-01 DIAGNOSIS — H353211 Exudative age-related macular degeneration, right eye, with active choroidal neovascularization: Secondary | ICD-10-CM | POA: Diagnosis not present

## 2022-03-01 DIAGNOSIS — H353221 Exudative age-related macular degeneration, left eye, with active choroidal neovascularization: Secondary | ICD-10-CM | POA: Diagnosis not present

## 2022-03-01 DIAGNOSIS — H43823 Vitreomacular adhesion, bilateral: Secondary | ICD-10-CM | POA: Diagnosis not present

## 2022-03-05 DIAGNOSIS — R10811 Right upper quadrant abdominal tenderness: Secondary | ICD-10-CM | POA: Diagnosis not present

## 2022-03-05 DIAGNOSIS — R10821 Right upper quadrant rebound abdominal tenderness: Secondary | ICD-10-CM | POA: Diagnosis not present

## 2022-03-05 DIAGNOSIS — R1011 Right upper quadrant pain: Secondary | ICD-10-CM | POA: Diagnosis not present

## 2022-04-02 DIAGNOSIS — Z85820 Personal history of malignant melanoma of skin: Secondary | ICD-10-CM | POA: Diagnosis not present

## 2022-04-02 DIAGNOSIS — L820 Inflamed seborrheic keratosis: Secondary | ICD-10-CM | POA: Diagnosis not present

## 2022-04-02 DIAGNOSIS — Z08 Encounter for follow-up examination after completed treatment for malignant neoplasm: Secondary | ICD-10-CM | POA: Diagnosis not present

## 2022-04-02 DIAGNOSIS — D225 Melanocytic nevi of trunk: Secondary | ICD-10-CM | POA: Diagnosis not present

## 2022-04-02 DIAGNOSIS — Z1283 Encounter for screening for malignant neoplasm of skin: Secondary | ICD-10-CM | POA: Diagnosis not present

## 2022-06-19 DIAGNOSIS — H43813 Vitreous degeneration, bilateral: Secondary | ICD-10-CM | POA: Diagnosis not present

## 2022-06-19 DIAGNOSIS — H353221 Exudative age-related macular degeneration, left eye, with active choroidal neovascularization: Secondary | ICD-10-CM | POA: Diagnosis not present

## 2022-06-19 DIAGNOSIS — H353211 Exudative age-related macular degeneration, right eye, with active choroidal neovascularization: Secondary | ICD-10-CM | POA: Diagnosis not present

## 2022-06-19 DIAGNOSIS — H43823 Vitreomacular adhesion, bilateral: Secondary | ICD-10-CM | POA: Diagnosis not present

## 2022-07-04 DIAGNOSIS — L298 Other pruritus: Secondary | ICD-10-CM | POA: Diagnosis not present

## 2022-07-04 DIAGNOSIS — L820 Inflamed seborrheic keratosis: Secondary | ICD-10-CM | POA: Diagnosis not present

## 2022-07-24 DIAGNOSIS — H353211 Exudative age-related macular degeneration, right eye, with active choroidal neovascularization: Secondary | ICD-10-CM | POA: Diagnosis not present

## 2022-07-24 DIAGNOSIS — H353221 Exudative age-related macular degeneration, left eye, with active choroidal neovascularization: Secondary | ICD-10-CM | POA: Diagnosis not present

## 2022-07-24 DIAGNOSIS — H43813 Vitreous degeneration, bilateral: Secondary | ICD-10-CM | POA: Diagnosis not present

## 2022-07-24 DIAGNOSIS — H43823 Vitreomacular adhesion, bilateral: Secondary | ICD-10-CM | POA: Diagnosis not present

## 2022-08-14 DIAGNOSIS — R799 Abnormal finding of blood chemistry, unspecified: Secondary | ICD-10-CM | POA: Diagnosis not present

## 2022-08-14 DIAGNOSIS — F419 Anxiety disorder, unspecified: Secondary | ICD-10-CM | POA: Diagnosis not present

## 2022-08-14 DIAGNOSIS — R413 Other amnesia: Secondary | ICD-10-CM | POA: Diagnosis not present

## 2022-08-14 DIAGNOSIS — F1010 Alcohol abuse, uncomplicated: Secondary | ICD-10-CM | POA: Diagnosis not present

## 2022-08-14 DIAGNOSIS — E871 Hypo-osmolality and hyponatremia: Secondary | ICD-10-CM | POA: Diagnosis not present

## 2022-08-14 DIAGNOSIS — E569 Vitamin deficiency, unspecified: Secondary | ICD-10-CM | POA: Diagnosis not present

## 2022-08-14 DIAGNOSIS — R3915 Urgency of urination: Secondary | ICD-10-CM | POA: Diagnosis not present

## 2022-08-14 DIAGNOSIS — E7800 Pure hypercholesterolemia, unspecified: Secondary | ICD-10-CM | POA: Diagnosis not present

## 2022-08-14 DIAGNOSIS — F1020 Alcohol dependence, uncomplicated: Secondary | ICD-10-CM | POA: Diagnosis not present

## 2022-08-14 DIAGNOSIS — I2510 Atherosclerotic heart disease of native coronary artery without angina pectoris: Secondary | ICD-10-CM | POA: Diagnosis not present

## 2022-08-14 DIAGNOSIS — Z0000 Encounter for general adult medical examination without abnormal findings: Secondary | ICD-10-CM | POA: Diagnosis not present

## 2022-08-14 DIAGNOSIS — E43 Unspecified severe protein-calorie malnutrition: Secondary | ICD-10-CM | POA: Diagnosis not present

## 2022-08-14 DIAGNOSIS — I10 Essential (primary) hypertension: Secondary | ICD-10-CM | POA: Diagnosis not present

## 2022-08-14 DIAGNOSIS — I739 Peripheral vascular disease, unspecified: Secondary | ICD-10-CM | POA: Diagnosis not present

## 2022-08-23 DIAGNOSIS — H43823 Vitreomacular adhesion, bilateral: Secondary | ICD-10-CM | POA: Diagnosis not present

## 2022-08-23 DIAGNOSIS — H353211 Exudative age-related macular degeneration, right eye, with active choroidal neovascularization: Secondary | ICD-10-CM | POA: Diagnosis not present

## 2022-08-23 DIAGNOSIS — H43813 Vitreous degeneration, bilateral: Secondary | ICD-10-CM | POA: Diagnosis not present

## 2022-08-23 DIAGNOSIS — H353221 Exudative age-related macular degeneration, left eye, with active choroidal neovascularization: Secondary | ICD-10-CM | POA: Diagnosis not present

## 2022-08-31 DIAGNOSIS — Z1231 Encounter for screening mammogram for malignant neoplasm of breast: Secondary | ICD-10-CM | POA: Diagnosis not present

## 2022-09-03 DIAGNOSIS — Z1231 Encounter for screening mammogram for malignant neoplasm of breast: Secondary | ICD-10-CM | POA: Diagnosis not present

## 2022-09-13 DIAGNOSIS — H353221 Exudative age-related macular degeneration, left eye, with active choroidal neovascularization: Secondary | ICD-10-CM | POA: Diagnosis not present

## 2022-09-13 DIAGNOSIS — H353211 Exudative age-related macular degeneration, right eye, with active choroidal neovascularization: Secondary | ICD-10-CM | POA: Diagnosis not present

## 2022-09-13 DIAGNOSIS — H43813 Vitreous degeneration, bilateral: Secondary | ICD-10-CM | POA: Diagnosis not present

## 2022-09-17 DIAGNOSIS — R92322 Mammographic fibroglandular density, left breast: Secondary | ICD-10-CM | POA: Diagnosis not present

## 2022-09-17 DIAGNOSIS — R928 Other abnormal and inconclusive findings on diagnostic imaging of breast: Secondary | ICD-10-CM | POA: Diagnosis not present

## 2022-10-03 DIAGNOSIS — X32XXXD Exposure to sunlight, subsequent encounter: Secondary | ICD-10-CM | POA: Diagnosis not present

## 2022-10-03 DIAGNOSIS — D225 Melanocytic nevi of trunk: Secondary | ICD-10-CM | POA: Diagnosis not present

## 2022-10-03 DIAGNOSIS — Z08 Encounter for follow-up examination after completed treatment for malignant neoplasm: Secondary | ICD-10-CM | POA: Diagnosis not present

## 2022-10-03 DIAGNOSIS — Z1283 Encounter for screening for malignant neoplasm of skin: Secondary | ICD-10-CM | POA: Diagnosis not present

## 2022-10-03 DIAGNOSIS — L570 Actinic keratosis: Secondary | ICD-10-CM | POA: Diagnosis not present

## 2022-10-03 DIAGNOSIS — Z85820 Personal history of malignant melanoma of skin: Secondary | ICD-10-CM | POA: Diagnosis not present

## 2022-10-12 DIAGNOSIS — H353211 Exudative age-related macular degeneration, right eye, with active choroidal neovascularization: Secondary | ICD-10-CM | POA: Diagnosis not present

## 2022-10-12 DIAGNOSIS — Z961 Presence of intraocular lens: Secondary | ICD-10-CM | POA: Diagnosis not present

## 2022-10-12 DIAGNOSIS — H26491 Other secondary cataract, right eye: Secondary | ICD-10-CM | POA: Diagnosis not present

## 2022-10-12 DIAGNOSIS — H43813 Vitreous degeneration, bilateral: Secondary | ICD-10-CM | POA: Diagnosis not present

## 2022-10-12 DIAGNOSIS — H43823 Vitreomacular adhesion, bilateral: Secondary | ICD-10-CM | POA: Diagnosis not present

## 2022-10-12 DIAGNOSIS — H353221 Exudative age-related macular degeneration, left eye, with active choroidal neovascularization: Secondary | ICD-10-CM | POA: Diagnosis not present

## 2022-11-20 DIAGNOSIS — H353211 Exudative age-related macular degeneration, right eye, with active choroidal neovascularization: Secondary | ICD-10-CM | POA: Diagnosis not present

## 2022-11-20 DIAGNOSIS — H353221 Exudative age-related macular degeneration, left eye, with active choroidal neovascularization: Secondary | ICD-10-CM | POA: Diagnosis not present

## 2022-12-27 DIAGNOSIS — H353211 Exudative age-related macular degeneration, right eye, with active choroidal neovascularization: Secondary | ICD-10-CM | POA: Diagnosis not present

## 2022-12-27 DIAGNOSIS — H353221 Exudative age-related macular degeneration, left eye, with active choroidal neovascularization: Secondary | ICD-10-CM | POA: Diagnosis not present

## 2023-01-01 DIAGNOSIS — Z008 Encounter for other general examination: Secondary | ICD-10-CM | POA: Diagnosis not present

## 2023-02-05 DIAGNOSIS — H353211 Exudative age-related macular degeneration, right eye, with active choroidal neovascularization: Secondary | ICD-10-CM | POA: Diagnosis not present

## 2023-02-05 DIAGNOSIS — H353221 Exudative age-related macular degeneration, left eye, with active choroidal neovascularization: Secondary | ICD-10-CM | POA: Diagnosis not present

## 2023-02-05 DIAGNOSIS — H43823 Vitreomacular adhesion, bilateral: Secondary | ICD-10-CM | POA: Diagnosis not present

## 2023-02-05 DIAGNOSIS — H43813 Vitreous degeneration, bilateral: Secondary | ICD-10-CM | POA: Diagnosis not present

## 2023-03-22 DIAGNOSIS — H353221 Exudative age-related macular degeneration, left eye, with active choroidal neovascularization: Secondary | ICD-10-CM | POA: Diagnosis not present

## 2023-03-22 DIAGNOSIS — H43822 Vitreomacular adhesion, left eye: Secondary | ICD-10-CM | POA: Diagnosis not present

## 2023-03-22 DIAGNOSIS — H26491 Other secondary cataract, right eye: Secondary | ICD-10-CM | POA: Diagnosis not present

## 2023-03-22 DIAGNOSIS — H353211 Exudative age-related macular degeneration, right eye, with active choroidal neovascularization: Secondary | ICD-10-CM | POA: Diagnosis not present

## 2023-03-22 DIAGNOSIS — H43811 Vitreous degeneration, right eye: Secondary | ICD-10-CM | POA: Diagnosis not present

## 2023-03-22 DIAGNOSIS — H43812 Vitreous degeneration, left eye: Secondary | ICD-10-CM | POA: Diagnosis not present

## 2023-04-24 DIAGNOSIS — R799 Abnormal finding of blood chemistry, unspecified: Secondary | ICD-10-CM | POA: Diagnosis not present

## 2023-04-24 DIAGNOSIS — R140 Abdominal distension (gaseous): Secondary | ICD-10-CM | POA: Diagnosis not present

## 2023-04-24 DIAGNOSIS — K5904 Chronic idiopathic constipation: Secondary | ICD-10-CM | POA: Diagnosis not present

## 2023-04-24 DIAGNOSIS — E7800 Pure hypercholesterolemia, unspecified: Secondary | ICD-10-CM | POA: Diagnosis not present

## 2023-04-24 DIAGNOSIS — K5900 Constipation, unspecified: Secondary | ICD-10-CM | POA: Diagnosis not present

## 2023-04-24 DIAGNOSIS — F1010 Alcohol abuse, uncomplicated: Secondary | ICD-10-CM | POA: Diagnosis not present

## 2023-04-24 DIAGNOSIS — R109 Unspecified abdominal pain: Secondary | ICD-10-CM | POA: Diagnosis not present

## 2023-04-24 DIAGNOSIS — E871 Hypo-osmolality and hyponatremia: Secondary | ICD-10-CM | POA: Diagnosis not present

## 2023-04-24 DIAGNOSIS — K760 Fatty (change of) liver, not elsewhere classified: Secondary | ICD-10-CM | POA: Diagnosis not present

## 2023-04-26 DIAGNOSIS — H43812 Vitreous degeneration, left eye: Secondary | ICD-10-CM | POA: Diagnosis not present

## 2023-04-26 DIAGNOSIS — H43822 Vitreomacular adhesion, left eye: Secondary | ICD-10-CM | POA: Diagnosis not present

## 2023-04-26 DIAGNOSIS — H353221 Exudative age-related macular degeneration, left eye, with active choroidal neovascularization: Secondary | ICD-10-CM | POA: Diagnosis not present

## 2023-04-26 DIAGNOSIS — H353211 Exudative age-related macular degeneration, right eye, with active choroidal neovascularization: Secondary | ICD-10-CM | POA: Diagnosis not present

## 2023-04-26 DIAGNOSIS — H43811 Vitreous degeneration, right eye: Secondary | ICD-10-CM | POA: Diagnosis not present

## 2023-05-16 DIAGNOSIS — R10811 Right upper quadrant abdominal tenderness: Secondary | ICD-10-CM | POA: Diagnosis not present

## 2023-05-16 DIAGNOSIS — K5904 Chronic idiopathic constipation: Secondary | ICD-10-CM | POA: Diagnosis not present

## 2023-05-20 DIAGNOSIS — F1010 Alcohol abuse, uncomplicated: Secondary | ICD-10-CM | POA: Diagnosis not present

## 2023-05-20 DIAGNOSIS — K5904 Chronic idiopathic constipation: Secondary | ICD-10-CM | POA: Diagnosis not present

## 2023-05-20 DIAGNOSIS — E7800 Pure hypercholesterolemia, unspecified: Secondary | ICD-10-CM | POA: Diagnosis not present

## 2023-05-20 DIAGNOSIS — K760 Fatty (change of) liver, not elsewhere classified: Secondary | ICD-10-CM | POA: Diagnosis not present

## 2023-05-20 DIAGNOSIS — E871 Hypo-osmolality and hyponatremia: Secondary | ICD-10-CM | POA: Diagnosis not present

## 2023-05-20 DIAGNOSIS — R799 Abnormal finding of blood chemistry, unspecified: Secondary | ICD-10-CM | POA: Diagnosis not present

## 2023-05-22 DIAGNOSIS — R1011 Right upper quadrant pain: Secondary | ICD-10-CM | POA: Diagnosis not present

## 2023-05-22 DIAGNOSIS — R10811 Right upper quadrant abdominal tenderness: Secondary | ICD-10-CM | POA: Diagnosis not present

## 2023-05-30 IMAGING — MR MR PELVIS W/O CM
10 of 11 series · 40 of 48 positions shown · non-contrast
Comparison: None

CLINICAL DATA: Post menopausal female with history of adnexal
masses.

EXAM:
MRI PELVIS WITHOUT CONTRAST
TECHNIQUE: Multiplanar multisequence MR imaging of the pelvis was performed. No
intravenous contrast was administered.

[Series 3: T2 · coronal · 6.0mm · 1.56mm/px · 3 of 33 slices shown (1 of 4)]
[im 1/33]
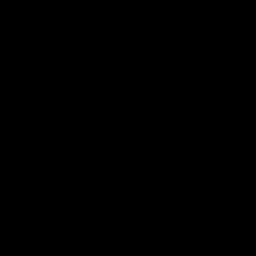
[im 17/33]
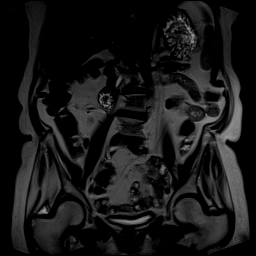
[im 33/33]
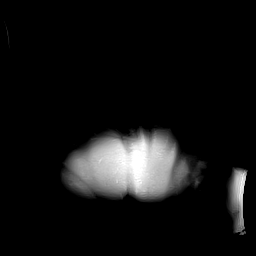

[Series 4: T2 · axial · 5.0mm · 0.51mm/px · z∈[-103,+95]mm · 3 of 34 slices shown (2 of 4)]
[im 1/34]
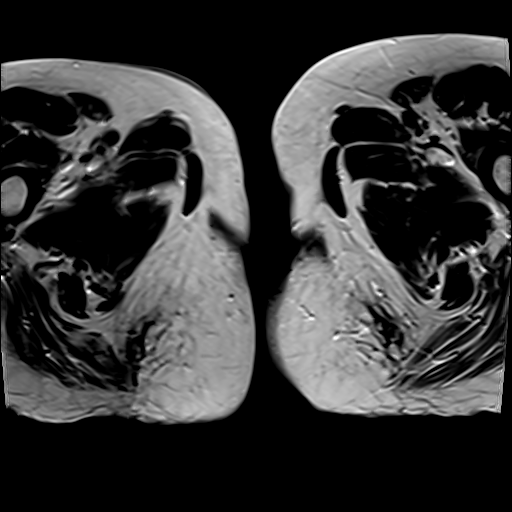
[im 17/34]
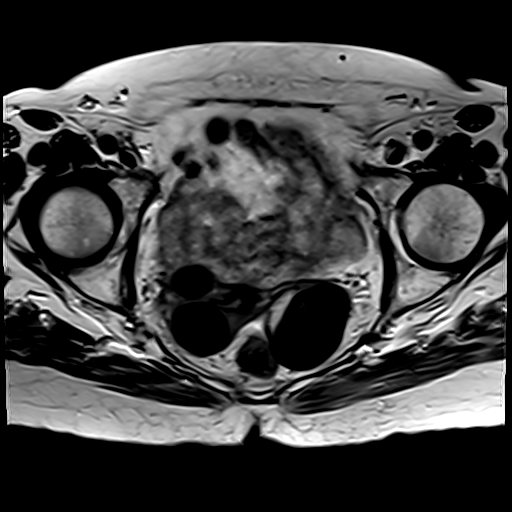
[im 34/34]
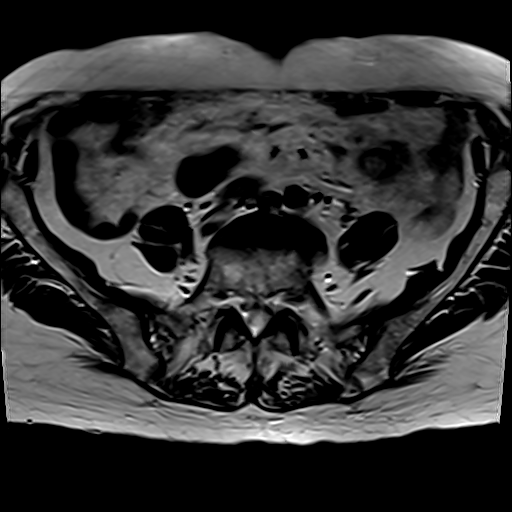

[Series 5: T2 · sagittal · 5.0mm · 0.55mm/px · 3 of 40 slices shown (3 of 4)]
[im 1/40]
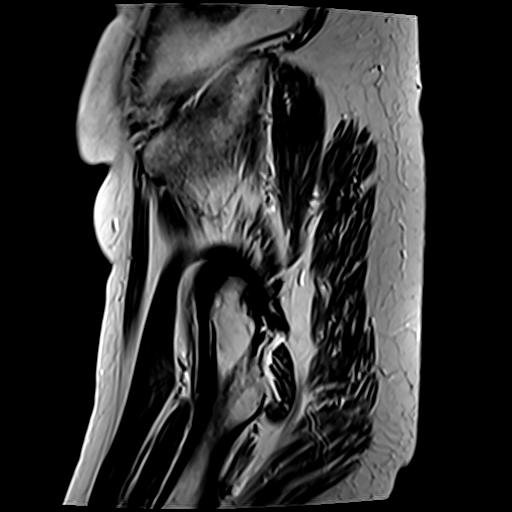
[im 20/40]
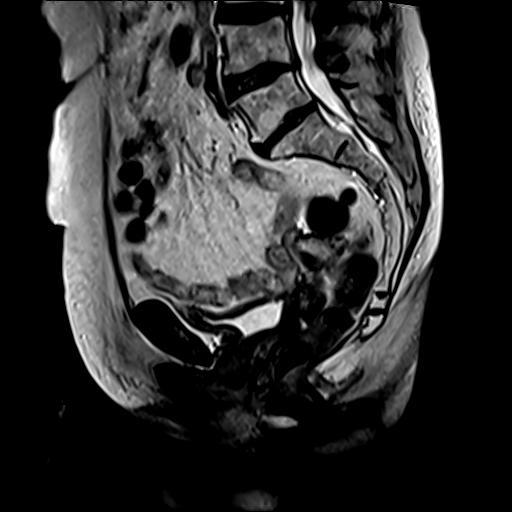
[im 40/40]
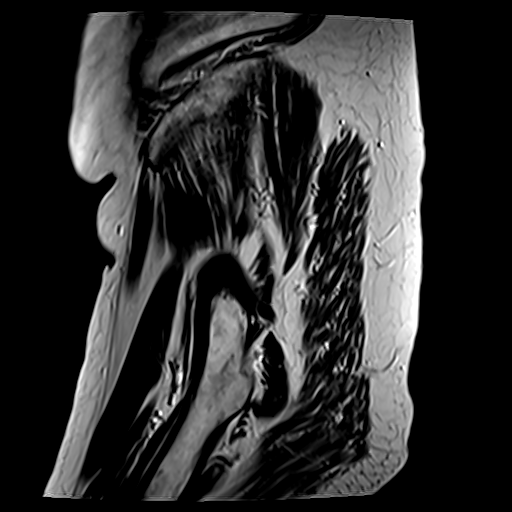

[Series 6: T2 · coronal · 4.0mm · 0.51mm/px · 3 of 38 slices shown (4 of 4)]
[im 1/38]
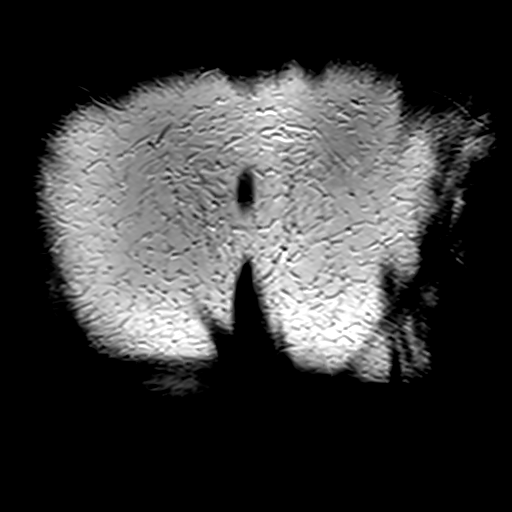
[im 19/38]
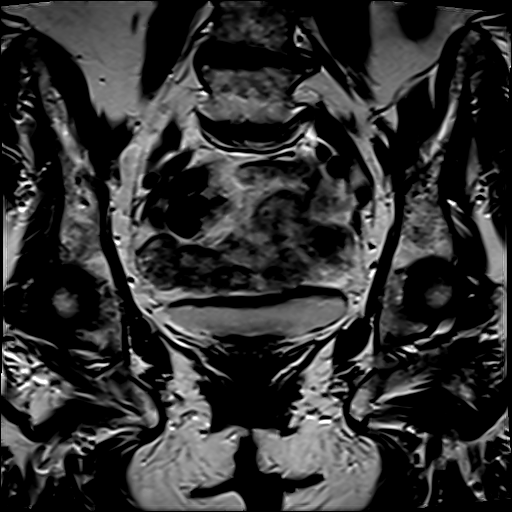
[im 38/38]
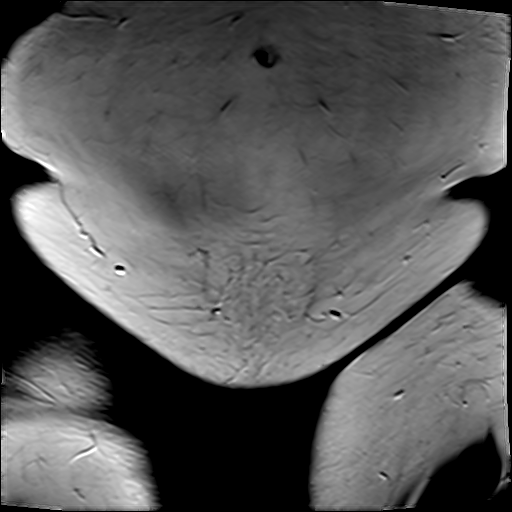

[Series 7: T2 fat-sat · axial · 5.0mm · 0.51mm/px · z∈[-103,+95]mm · 3 of 34 slices shown]
[im 1/34]
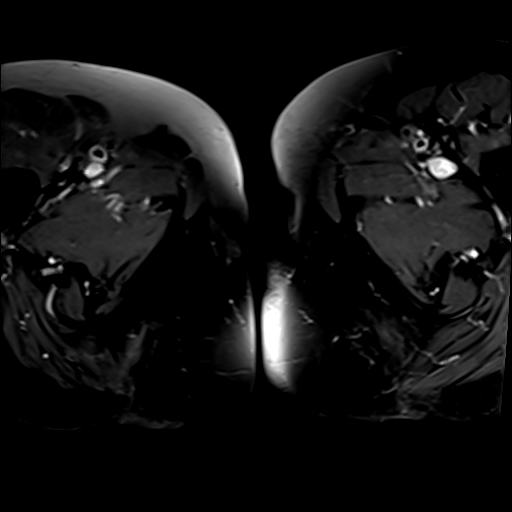
[im 17/34]
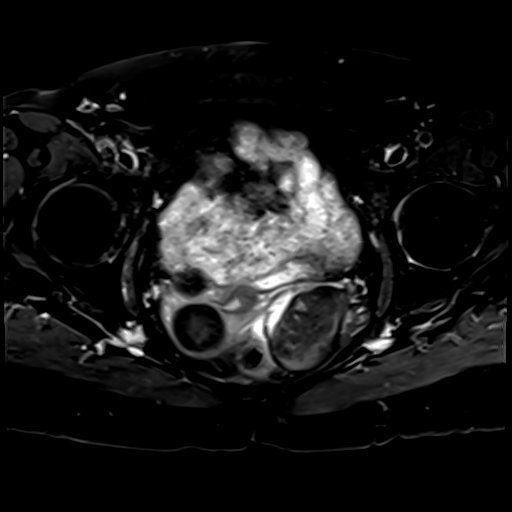
[im 34/34]
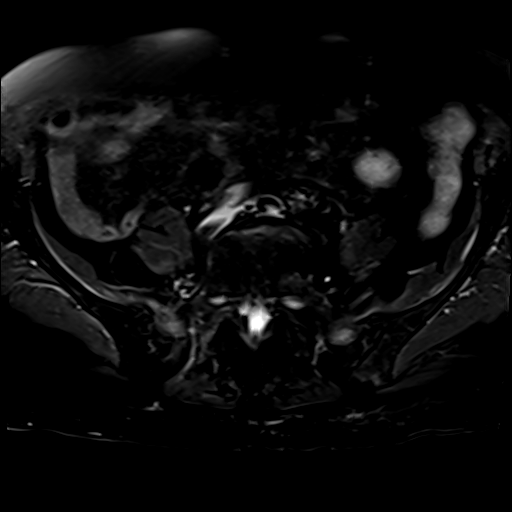

[Series 8: T1 · axial · 4.0mm · 0.84mm/px · z∈[-146,+138]mm · 6 of 72 slices shown (1 of 2)]
[im 1/72]
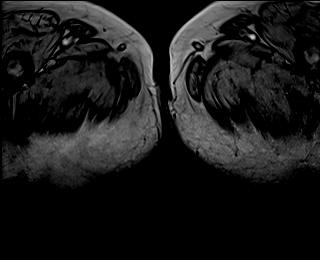
[im 15/72]
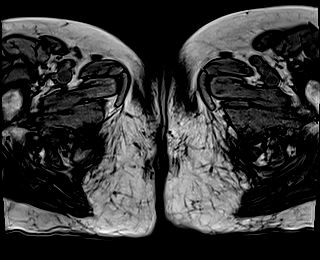
[im 29/72]
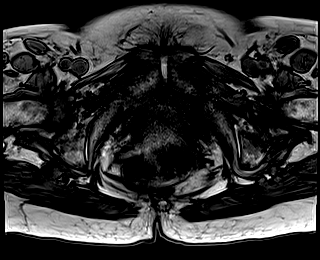
[im 43/72]
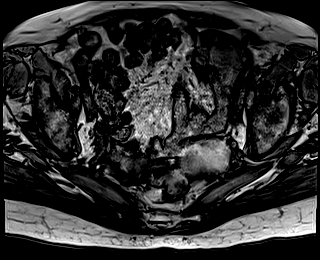
[im 57/72]
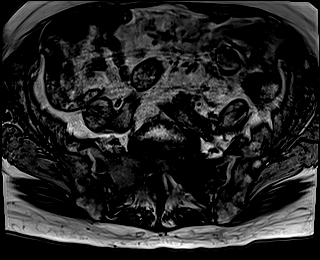
[im 72/72]
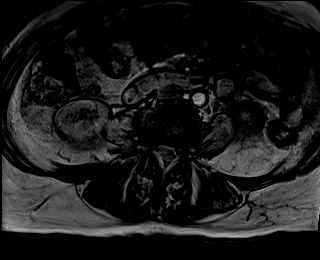

[Series 9: T1 · axial · 4.0mm · 0.84mm/px · z∈[-146,+138]mm · 6 of 72 slices shown (2 of 2)]
[im 1/72]
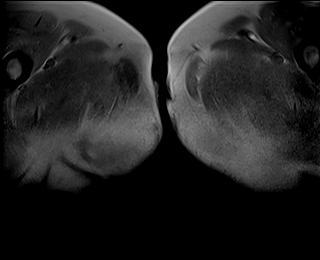
[im 15/72]
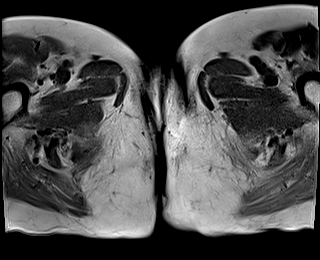
[im 29/72]
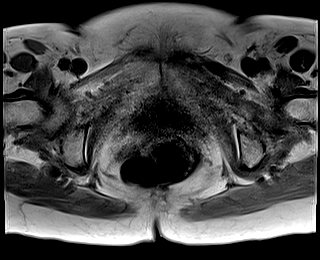
[im 43/72]
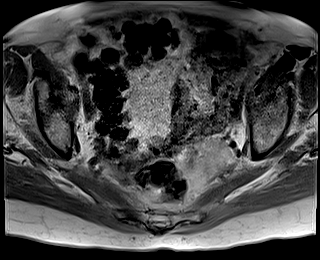
[im 57/72]
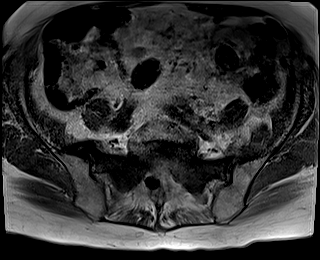
[im 72/72]
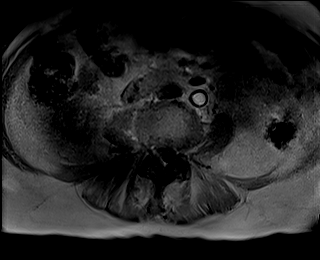

[Series 10: DWI · axial · 5.0mm · 2.80mm/px · z∈[-97,+63]mm · 8 of 91 slices shown (1 of 3)]
[im 1/91]
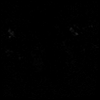
[im 13/91]
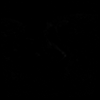
[im 26/91]
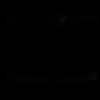
[im 39/91]
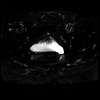
[im 52/91]
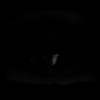
[im 65/91]
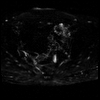
[im 78/91]
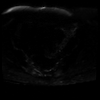
[im 91/91]
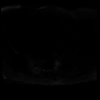

[Series 11: DWI · axial · 5.0mm · 2.80mm/px · z∈[-97,+63]mm · 3 of 33 slices shown (2 of 3)]
[im 1/33]
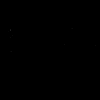
[im 17/33]
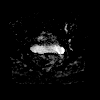
[im 33/33]
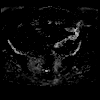

[Series 12: DWI · axial · 5.0mm · 2.80mm/px · z∈[-97,-17]mm · 2 of 33 slices shown (3 of 3)]
[im 1/33]
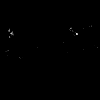
[im 17/33]
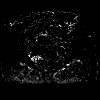

[40 of 48 positions shown; findings below may reference images not displayed]

FINDINGS: Urinary Tract: Urinary bladder is decompressed. No signs of distal
ureteral dilation.

Bowel: Colonic diverticulosis. No acute bowel process to the extent
evaluated.

Vascular/Lymphatic: No adenopathy in the pelvis. Vascular structures
not well assessed given the absence of intravenous contrast.

Reproductive: RIGHT uterine leiomyomata largest 2.3 x 2.9 cm, the
smaller of these leiomyomata at 1.7 x 1.5 cm.

RIGHT ovary difficult to separate from RIGHT uterine fundus suspect
it is present on image 14 of series 7. Intermediate T2 signal within
the area of the RIGHT ovary measuring 15 x 9 mm.

LEFT ovary with mixed signal on T2, predominantly low signal area
measuring 5.3 x 3.2 cm greatest craniocaudal dimension approximately
4.9 cm, this also shows some intermediate to slightly increased T2
signal. No contrast was administered.

Serpiginous area of high T2 signal extends from the LEFT uterine
fundus towards the LEFT adnexa. This area is measured separately
measures approximately 4.2 x 2.4 cm but is directly contiguous with
the above area of predominantly low signal on T2. There is a rim of
low signal on T2 surrounding this location.

Increased signal on T1 is seen diffusely in the area of concern in
the LEFT adnexa.

Globally there is increased T1 signal throughout the LEFT adnexal
"lesion".

Other:  No diffuse ascites.

Musculoskeletal: Increased T2 signal throughout the bilateral sacral
ala in a pattern that suggests sacral insufficiency fractures.
Diffuse edema in the bilateral sacral ala and low T1 signal and
suggestion of early fracture lines on coronal T2 images.
IMPRESSION: LEFT adnexal mass is indeterminate without intravenous contrast but
suspicious for ovarian neoplasm with intermittent chronic bleeding.
Given signal characteristics would also consider the possibility of
sequela of endometriosis, correlation with history may be helpful.
The possibility of malignant transformation in this area is also
considered based on patient history. No contrast was administered to
allow for assessment of areas of concern. This might add specificity
as warranted for further evaluation.

By report the patient did not wish to receive contrast on the date
of the study.

RIGHT uterine leiomyomata.

No RIGHT adnexal mass.  RIGHT ovary not well seen.

Signs of sacral insufficiency fractures.

## 2023-06-03 DIAGNOSIS — H43812 Vitreous degeneration, left eye: Secondary | ICD-10-CM | POA: Diagnosis not present

## 2023-06-03 DIAGNOSIS — H43822 Vitreomacular adhesion, left eye: Secondary | ICD-10-CM | POA: Diagnosis not present

## 2023-06-03 DIAGNOSIS — H43811 Vitreous degeneration, right eye: Secondary | ICD-10-CM | POA: Diagnosis not present

## 2023-06-03 DIAGNOSIS — H353211 Exudative age-related macular degeneration, right eye, with active choroidal neovascularization: Secondary | ICD-10-CM | POA: Diagnosis not present

## 2023-06-03 DIAGNOSIS — H353221 Exudative age-related macular degeneration, left eye, with active choroidal neovascularization: Secondary | ICD-10-CM | POA: Diagnosis not present

## 2023-06-12 DIAGNOSIS — R140 Abdominal distension (gaseous): Secondary | ICD-10-CM | POA: Diagnosis not present

## 2023-06-12 DIAGNOSIS — K5904 Chronic idiopathic constipation: Secondary | ICD-10-CM | POA: Diagnosis not present

## 2023-06-12 DIAGNOSIS — F1010 Alcohol abuse, uncomplicated: Secondary | ICD-10-CM | POA: Diagnosis not present

## 2023-06-12 DIAGNOSIS — Z86018 Personal history of other benign neoplasm: Secondary | ICD-10-CM | POA: Diagnosis not present

## 2023-06-12 DIAGNOSIS — Z9889 Other specified postprocedural states: Secondary | ICD-10-CM | POA: Diagnosis not present

## 2023-06-12 DIAGNOSIS — R1011 Right upper quadrant pain: Secondary | ICD-10-CM | POA: Diagnosis not present

## 2023-06-12 DIAGNOSIS — G8929 Other chronic pain: Secondary | ICD-10-CM | POA: Diagnosis not present

## 2023-06-12 DIAGNOSIS — R1012 Left upper quadrant pain: Secondary | ICD-10-CM | POA: Diagnosis not present

## 2023-06-12 DIAGNOSIS — K760 Fatty (change of) liver, not elsewhere classified: Secondary | ICD-10-CM | POA: Diagnosis not present

## 2023-06-12 DIAGNOSIS — E871 Hypo-osmolality and hyponatremia: Secondary | ICD-10-CM | POA: Diagnosis not present

## 2023-06-18 DIAGNOSIS — K760 Fatty (change of) liver, not elsewhere classified: Secondary | ICD-10-CM | POA: Diagnosis not present

## 2023-06-18 DIAGNOSIS — R1031 Right lower quadrant pain: Secondary | ICD-10-CM | POA: Diagnosis not present

## 2023-06-18 DIAGNOSIS — K5904 Chronic idiopathic constipation: Secondary | ICD-10-CM | POA: Diagnosis not present

## 2023-06-18 DIAGNOSIS — R194 Change in bowel habit: Secondary | ICD-10-CM | POA: Diagnosis not present

## 2023-06-18 DIAGNOSIS — F1010 Alcohol abuse, uncomplicated: Secondary | ICD-10-CM | POA: Diagnosis not present

## 2023-06-18 DIAGNOSIS — R1012 Left upper quadrant pain: Secondary | ICD-10-CM | POA: Diagnosis not present

## 2023-06-18 DIAGNOSIS — R1011 Right upper quadrant pain: Secondary | ICD-10-CM | POA: Diagnosis not present

## 2023-06-18 DIAGNOSIS — Z9889 Other specified postprocedural states: Secondary | ICD-10-CM | POA: Diagnosis not present

## 2023-06-18 DIAGNOSIS — Z86018 Personal history of other benign neoplasm: Secondary | ICD-10-CM | POA: Diagnosis not present

## 2023-06-18 DIAGNOSIS — K5900 Constipation, unspecified: Secondary | ICD-10-CM | POA: Diagnosis not present

## 2023-06-18 DIAGNOSIS — R140 Abdominal distension (gaseous): Secondary | ICD-10-CM | POA: Diagnosis not present

## 2023-06-18 DIAGNOSIS — G8929 Other chronic pain: Secondary | ICD-10-CM | POA: Diagnosis not present

## 2023-06-18 DIAGNOSIS — R102 Pelvic and perineal pain: Secondary | ICD-10-CM | POA: Diagnosis not present

## 2023-06-18 DIAGNOSIS — E871 Hypo-osmolality and hyponatremia: Secondary | ICD-10-CM | POA: Diagnosis not present

## 2023-07-15 DIAGNOSIS — H353211 Exudative age-related macular degeneration, right eye, with active choroidal neovascularization: Secondary | ICD-10-CM | POA: Diagnosis not present

## 2023-07-15 DIAGNOSIS — H353221 Exudative age-related macular degeneration, left eye, with active choroidal neovascularization: Secondary | ICD-10-CM | POA: Diagnosis not present

## 2023-07-15 DIAGNOSIS — H43811 Vitreous degeneration, right eye: Secondary | ICD-10-CM | POA: Diagnosis not present

## 2023-07-15 DIAGNOSIS — H43812 Vitreous degeneration, left eye: Secondary | ICD-10-CM | POA: Diagnosis not present
